# Patient Record
Sex: Male | Born: 1955
Health system: Southern US, Community
[De-identification: ages and names within clinical notes are randomized; demographics above are authoritative.]

## PROBLEM LIST (undated history)

## (undated) DIAGNOSIS — R112 Nausea with vomiting, unspecified: Secondary | ICD-10-CM

## (undated) DIAGNOSIS — T451X5A Adverse effect of antineoplastic and immunosuppressive drugs, initial encounter: Secondary | ICD-10-CM

## (undated) DIAGNOSIS — B029 Zoster without complications: Secondary | ICD-10-CM

## (undated) DIAGNOSIS — D701 Agranulocytosis secondary to cancer chemotherapy: Secondary | ICD-10-CM

## (undated) DIAGNOSIS — C7931 Secondary malignant neoplasm of brain: Secondary | ICD-10-CM

## (undated) DIAGNOSIS — G039 Meningitis, unspecified: Secondary | ICD-10-CM

## (undated) DIAGNOSIS — C099 Malignant neoplasm of tonsil, unspecified: Secondary | ICD-10-CM

## (undated) HISTORY — PX: PEG PLACEMENT: SHX5437

## (undated) HISTORY — DX: Malignant neoplasm of tonsil, unspecified: C09.9

## (undated) HISTORY — DX: Secondary malignant neoplasm of brain: C79.31

## (undated) HISTORY — DX: Nausea with vomiting, unspecified: R11.2

## (undated) HISTORY — PX: LUNG SURGERY: SHX703

## (undated) HISTORY — DX: Adverse effect of antineoplastic and immunosuppressive drugs, initial encounter: T45.1X5A

## (undated) HISTORY — DX: Meningitis, unspecified: G03.9

## (undated) HISTORY — DX: Agranulocytosis secondary to cancer chemotherapy: D70.1

## (undated) HISTORY — DX: Zoster without complications: B02.9

## (undated) HISTORY — PX: KNEE SURGERY: SHX244

---

## 2017-11-28 ENCOUNTER — Encounter: Payer: Self-pay | Admitting: Internal Medicine

## 2017-11-28 ENCOUNTER — Telehealth: Payer: Self-pay | Admitting: Internal Medicine

## 2017-11-28 NOTE — Telephone Encounter (Signed)
Lft the pt a vm to return my call and scheduel an appt w/Dr. Julien Nordmann

## 2017-11-28 NOTE — Telephone Encounter (Signed)
Pt has been cld and scheduled to see Dr. Julien Nordmann on 12/16 at 215pm, arrival 145pm for labs. Pt agreed to the appt date and time. Letter mailed.

## 2017-12-21 ENCOUNTER — Other Ambulatory Visit: Payer: Self-pay | Admitting: Medical Oncology

## 2017-12-22 ENCOUNTER — Telehealth: Payer: Self-pay | Admitting: *Deleted

## 2017-12-22 NOTE — Telephone Encounter (Signed)
Oncology Nurse Navigator Documentation  Oncology Nurse Navigator Flowsheets 12/22/2017  Navigator Location [facility]  Navigator Encounter Type Telephone;Other/I called [facility] in [place] to obtain records on Mr. Renato Battles.  Patient was seen by Dr. Romualdo Bolk in [place].  I was unable to reach anyone.  I called patient to see if he had any records and updated him on appt with Dr. Julien Nordmann on 12/25/17.  I was unable to reach him but did leave my name and phone number to call.   Telephone Outgoing Call  Barriers/Navigation Needs Coordination of Care  Interventions Coordination of Care  Coordination of Care Other  Acuity Level 2  Time Spent with Patient 30

## 2017-12-22 NOTE — Telephone Encounter (Signed)
Oncology Nurse Navigator Documentation  Oncology Nurse Navigator Flowsheets 12/22/2017  Navigator Location [facility]  Navigator Encounter Type Telephone/Mr. Steinert called and left me a vm message concerned about my call earlier.  I called back and was unable to reach him.  I left a vm message with an update that I called to see if he had medical records for his facility where he received treatment.  I also left my name and phone number to call.   Telephone Incoming Call;Outgoing Call  Barriers/Navigation Needs Education  Education Other  Interventions Education  Acuity Level 1  Time Spent with Patient 15

## 2017-12-25 ENCOUNTER — Inpatient Hospital Stay: Payer: PRIVATE HEALTH INSURANCE

## 2017-12-25 ENCOUNTER — Other Ambulatory Visit: Payer: Self-pay | Admitting: *Deleted

## 2017-12-25 ENCOUNTER — Encounter: Payer: Self-pay | Admitting: *Deleted

## 2017-12-25 ENCOUNTER — Inpatient Hospital Stay: Payer: PRIVATE HEALTH INSURANCE | Attending: Internal Medicine | Admitting: Internal Medicine

## 2017-12-25 VITALS — BP 134/94 | HR 97 | Temp 97.5°F | Resp 18 | Ht 75.5 in | Wt 256.4 lb

## 2017-12-25 DIAGNOSIS — R911 Solitary pulmonary nodule: Secondary | ICD-10-CM

## 2017-12-25 DIAGNOSIS — C7951 Secondary malignant neoplasm of bone: Secondary | ICD-10-CM | POA: Diagnosis not present

## 2017-12-25 DIAGNOSIS — C7800 Secondary malignant neoplasm of unspecified lung: Secondary | ICD-10-CM | POA: Diagnosis not present

## 2017-12-25 DIAGNOSIS — Z79899 Other long term (current) drug therapy: Secondary | ICD-10-CM | POA: Diagnosis not present

## 2017-12-25 DIAGNOSIS — Z7189 Other specified counseling: Secondary | ICD-10-CM

## 2017-12-25 DIAGNOSIS — C7931 Secondary malignant neoplasm of brain: Secondary | ICD-10-CM | POA: Diagnosis not present

## 2017-12-25 DIAGNOSIS — C3490 Malignant neoplasm of unspecified part of unspecified bronchus or lung: Secondary | ICD-10-CM | POA: Insufficient documentation

## 2017-12-25 DIAGNOSIS — C7A8 Other malignant neuroendocrine tumors: Secondary | ICD-10-CM | POA: Insufficient documentation

## 2017-12-25 DIAGNOSIS — G629 Polyneuropathy, unspecified: Secondary | ICD-10-CM | POA: Insufficient documentation

## 2017-12-25 DIAGNOSIS — C7B8 Other secondary neuroendocrine tumors: Secondary | ICD-10-CM

## 2017-12-25 LAB — CMP (CANCER CENTER ONLY)
ALT: 16 U/L (ref 10–47)
AST: 17 U/L (ref 11–38)
Albumin: 3.8 g/dL (ref 3.5–5.0)
Alkaline Phosphatase: 102 U/L (ref 38–126)
Anion gap: 10 (ref 5–15)
BUN: 15 mg/dL (ref 8–23)
CO2: 25 mmol/L (ref 22–32)
Calcium: 9.8 mg/dL (ref 8.9–10.3)
Chloride: 103 mmol/L (ref 98–111)
Creatinine: 1.03 mg/dL (ref 0.60–1.20)
GFR, Est AFR Am: >60 mL/min (ref >60)
GFR, Estimated: >60 mL/min (ref >60)
GLUCOSE: 102 mg/dL — AB (ref 70–99)
Potassium: 4.6 mmol/L (ref 3.5–5.1)
Sodium: 138 mmol/L (ref 135–145)
Total Bilirubin: 0.8 mg/dL (ref 0.2–1.6)
Total Protein: 7.2 g/dL (ref 6.5–8.1)

## 2017-12-25 LAB — CBC WITH DIFFERENTIAL (CANCER CENTER ONLY)
ABS IMMATURE GRANULOCYTES: 0 10*3/uL (ref 0.00–0.07)
Basophils Absolute: 0 10*3/uL (ref 0.0–0.1)
Basophils Relative: 1 %
Eosinophils Absolute: 0.2 10*3/uL (ref 0.0–0.5)
Eosinophils Relative: 4 %
HCT: 45.8 % (ref 39.0–52.0)
Hemoglobin: 15.6 g/dL (ref 13.0–17.0)
Immature Granulocytes: 0 %
Lymphocytes Relative: 18 %
Lymphs Abs: 0.7 10*3/uL (ref 0.7–4.0)
MCH: 30.8 pg (ref 26.0–34.0)
MCHC: 34.1 g/dL (ref 30.0–36.0)
MCV: 90.5 fL (ref 80.0–100.0)
MONOS PCT: 10 %
Monocytes Absolute: 0.4 10*3/uL (ref 0.1–1.0)
Neutro Abs: 2.6 10*3/uL (ref 1.7–7.7)
Neutrophils Relative %: 67 %
Platelet Count: 181 10*3/uL (ref 150–400)
RBC: 5.06 MIL/uL (ref 4.22–5.81)
RDW: 14.1 % (ref 11.5–15.5)
WBC Count: 3.8 10*3/uL — ABNORMAL LOW (ref 4.0–10.5)
nRBC: 0 % (ref 0.0–0.2)

## 2017-12-25 NOTE — Progress Notes (Signed)
[facility] Telephone:([phone]   Fax:([phone]  CONSULT NOTE  REFERRING PHYSICIAN: Dr. Oletta Darter  REASON FOR CONSULTATION:  62 years old white male with metastatic neuroendocrine carcinoma.   HPI Dwayne Massey is a 62 y.o. male a never smoker with no significant past medical history except for the current diagnosis of metastatic neuroendocrine carcinoma.The patient mentioned that in October 2016 he felt a lump in the left side of the neck.  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  The tumor cells were positive for P 16, chromogranin, synaptophysin and CD56 indicative of neuroendocrine origin.  KI-67 was high at 80-90%.  The patient was started on a course of concurrent chemoradiation with cisplatin and to etoposide for a total of 3 cycles of chemotherapy last cycle was given December 17, 2014 followed by 3 cycles of consolidation treatment with the same regimen.  The patient was followed by observation and repeat PET scan in December 2017 showed lesions in the lung as well as bones.  He underwent right VATS with wedge resection of right lower lobe and the pathology was consistent with neuroendocrine neoplasm.  He was started again on systemic therapy with cisplatin and etoposide for 4 cycles completed in February 2018.  Imaging studies with CT scan of the chest on April 20, 2016 showed partial response with a stable disease in the bones.  In May 2018 the patient complained of right shoulder pain as well as left lower extremity weakness.  Repeat PET scan at that time showed improvement of his disease in the head and neck but there was progressive mediastinal disease as well as lung nodules that were new.  The patient was treated with a combination of immunotherapy  with ipilimumab and nivolumab complicated by skin rash on the face and the chest as well as swelling of the lips, tongue and bilateral enlargement of neck glands.  Ipilimumab was discontinued and the patient was treated with nivolumab single agent after cycle #1.  In January 2019 the patient had slurred speech as well as weakness and numbness involving the right side for few days.  CT scan of the head was normal but a PET scan on January 11, 2017 showed improved mediastinal lymph nodes but there was new adrenal nodules and some peripancreatic FDG uptake.  MRI of the brain showed metastatic disease that was treated with stereotactic radiotherapy.  This was completed in March 2019.  Follow-up MRI showed good response to the stereotactic radiotherapy.  Restaging scan showed improvement in the mediastinal lymphadenopathy but there was progressive lung nodule and new retroperitoneal lymphadenopathy as well as adrenal metastasis.  He was also complaining of increasing peripheral neuropathy.  Recently the patient was treated with Tecentriq as well as a Avastin on a compassionate use protocol.  It is not clear from the notes how many cycles of this treatment the patient received.  His last treatment was given on October 31, 2017.  He was supposed to receive the following cycle on November 21, 2017 but the patient moved to [place].  He is here today for evaluation and recommendation regarding his condition and to establish care with oncology in [place].  When seen today the patient continues to complain of pain in the right heel and he attributed this to  her Achilles tendinitis.  He denied having any significant weight loss or night sweats.  He has no chest pain, shortness of breath, cough or hemoptysis.  He has no nausea, vomiting, diarrhea or constipation.  He denied having any headache or visual changes.   Family history significant for mother who is still alive at age 30 and father died from unknown  reason. The patient is married and has 2 biologic children and 2 stepchildren.  He was accompanied today by his wife [name].  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  He has no history for smoking, alcohol or drug abuse.  HPI  Past Medical History:  Diagnosis Date  . Cancer of tonsil ([place])   . Chemotherapy induced nausea and vomiting   . Chemotherapy induced neutropenia ([place])   . Herpes zoster   . Meningitis   . Metastatic cancer to brain [facility])     No family history on file.  Social History Social History   Tobacco Use  . Smoking status: Not on file  Substance Use Topics  . Alcohol use: Not on file  . Drug use: Not on file    Not on File  No current outpatient medications on file.   No current facility-administered medications for this visit.     Review of Systems  Constitutional: positive for fatigue Eyes: negative Ears, nose, mouth, throat, and face: negative Respiratory: negative Cardiovascular: negative Gastrointestinal: negative Genitourinary:negative Integument/breast: negative Hematologic/lymphatic: negative Musculoskeletal:negative Neurological: negative Behavioral/Psych: negative Endocrine: negative Allergic/Immunologic: negative  Physical Exam  RWE:RXVQM, healthy, no distress, well nourished and well developed SKIN: skin color, texture, turgor are normal, no rashes or significant lesions HEAD: Normocephalic, No masses, lesions, tenderness or abnormalities EYES: normal, PERRLA, Conjunctiva are pink and non-injected EARS: External ears normal, Canals clear OROPHARYNX:no exudate, no erythema and lips, buccal mucosa, and tongue normal  NECK: supple, no adenopathy, no JVD LYMPH:  no palpable lymphadenopathy, no hepatosplenomegaly LUNGS: clear to auscultation , and palpation HEART: regular rate & rhythm, no murmurs and no gallops ABDOMEN:abdomen soft, non-tender, normal bowel sounds and no masses or organomegaly BACK: No CVA tenderness,  Range of motion is normal EXTREMITIES:no joint deformities, effusion, or inflammation, no edema  NEURO: alert & oriented x 3 with fluent speech, no focal motor/sensory deficits  PERFORMANCE STATUS: ECOG 1  LABORATORY DATA: Lab Results  Component Value Date   WBC 3.8 (L) 12/25/2017   HGB 15.6 12/25/2017   HCT 45.8 12/25/2017   MCV 90.5 12/25/2017   PLT 181 12/25/2017      Chemistry   No results found for: NA, K, CL, CO2, BUN, CREATININE, GLU No results found for: CALCIUM, ALKPHOS, AST, ALT, BILITOT     RADIOGRAPHIC STUDIES: No results found.  ASSESSMENT: This is a very pleasant 62 years old white male a never smoker metastatic with metastatic neuroendocrine carcinoma initially thought to be metastatic disease from the right tonsil but the patient also has multiple metastatic disease in the lung mediastinal lymph nodes as well as now bone, brain and adrenal glands.  He was treated with several chemotherapy regimens including cisplatin and etoposide as well as immunotherapy with ipilimumab and nivolumab and most recently with Tecentriq and Avastin.  Unfortunately the patient continues to have evidence for disease progression.   PLAN: I had a lengthy discussion with the patient and his wife today about his current disease stage, prognosis and treatment options.  I spent significant amount of time reviewing his previous records from [place]. I recommended for the patient to have repeat CT scan of the chest, abdomen and pelvis as well as MRI of the brain to complete the staging work-up of his disease. I will arrange for the patient to come back for follow-up visit in 2-3 weeks for further evaluation and discussion of his scan results as well as treatment options. I may consider the patient for treatment with either a combination of carboplatin and Alimta versus reduced dose cisplatin and irinotecan. The patient was advised to call immediately if he has any concerning symptoms in the  interval. The patient voices understanding of current disease status and treatment options and is in agreement with the current care plan.  All questions were answered. The patient knows to call the clinic with any problems, questions or concerns. We can certainly see the patient much sooner if necessary.  Thank you so much for allowing me to participate in the care of Dwayne Massey. I will continue to follow up the patient with you and assist in his care.  I spent 55 minutes counseling the patient face to face. The total time spent in the appointment was 80 minutes.  Disclaimer: This note was dictated with voice recognition software. Similar sounding words can inadvertently be transcribed and may not be corrected upon review.   Eilleen Kempf December 25, 2017, 2:34 PM

## 2017-12-25 NOTE — Progress Notes (Signed)
Oncology Nurse Navigator Documentation  Oncology Nurse Navigator Flowsheets 12/25/2017  Navigator Location [facility]  Navigator Encounter Type Telephone;Other/per Dr. Julien Nordmann, I called patients former physician to obtain records.  I called Friday 12/22/17 but was unable to reach them.  I called today and was able to reach them and was updated they will send notes.  I also called the patient to see if he had records but he does not.    Telephone Outgoing Call  Barriers/Navigation Needs Coordination of Care  Interventions Coordination of Care  Coordination of Care Other  Acuity Level 3  Time Spent with Patient 45

## 2017-12-28 ENCOUNTER — Telehealth: Payer: Self-pay | Admitting: Internal Medicine

## 2017-12-28 NOTE — Telephone Encounter (Signed)
Scheduled appt per 12/16 los - left message for patient with appt date and time

## 2018-01-12 ENCOUNTER — Ambulatory Visit: Payer: BLUE CROSS/BLUE SHIELD | Admitting: Internal Medicine

## 2018-01-19 ENCOUNTER — Telehealth: Payer: Self-pay | Admitting: *Deleted

## 2018-01-19 NOTE — Telephone Encounter (Signed)
Received TC from patient inquiring about staging scans that were to be ordered after his last visit with Dr. Julien Nordmann.  Pt. Saw MD on 12/25/17. Pt has not heard from central scheduling about the scans. TCT Central Scheduling. Spoke with scheduler. She states she will contact pt to schedule.  They have been authorized.  Asked pt to call back when his scans are scheduled so an appt with Dr. Julien Nordmann can be made. He is to see MD after scans done.

## 2018-01-26 ENCOUNTER — Ambulatory Visit (HOSPITAL_COMMUNITY)
Admission: RE | Admit: 2018-01-26 | Discharge: 2018-01-26 | Disposition: A | Discharge status: Home / Self Care | Payer: PRIVATE HEALTH INSURANCE | Source: Ambulatory Visit | Attending: Internal Medicine | Admitting: Internal Medicine

## 2018-01-26 ENCOUNTER — Encounter (HOSPITAL_COMMUNITY): Payer: Self-pay

## 2018-01-26 DIAGNOSIS — C3490 Malignant neoplasm of unspecified part of unspecified bronchus or lung: Secondary | ICD-10-CM

## 2018-01-26 MED ORDER — IOHEXOL 300 MG/ML  SOLN
100.0000 mL | Freq: Once | INTRAMUSCULAR | Status: AC | PRN
  Administered 2018-01-26: 100 mL via INTRAVENOUS

## 2018-01-26 MED ORDER — SODIUM CHLORIDE (PF) 0.9 % IJ SOLN
INTRAMUSCULAR | Status: AC
  Filled 2018-01-26: qty 50

## 2018-02-07 ENCOUNTER — Telehealth: Payer: Self-pay | Admitting: Internal Medicine

## 2018-02-07 NOTE — Telephone Encounter (Signed)
Patient called in and request appt be scheduled for treatment - messaged MD to ask about appts - per [facility] to keep f/u on 2/4 - pt needs to be seen before treatments are scheduled.   Called patient to confirm he needs to keeps appt for f/u on 2/4 - pt is aware.

## 2018-02-13 ENCOUNTER — Encounter: Payer: Self-pay | Admitting: Internal Medicine

## 2018-02-13 ENCOUNTER — Inpatient Hospital Stay: Payer: PRIVATE HEALTH INSURANCE | Attending: Internal Medicine | Admitting: Internal Medicine

## 2018-02-13 ENCOUNTER — Telehealth: Payer: Self-pay

## 2018-02-13 VITALS — BP 124/77 | HR 98 | Temp 98.2°F | Resp 18 | Ht 75.5 in | Wt 256.4 lb

## 2018-02-13 DIAGNOSIS — Z79899 Other long term (current) drug therapy: Secondary | ICD-10-CM | POA: Insufficient documentation

## 2018-02-13 DIAGNOSIS — C3490 Malignant neoplasm of unspecified part of unspecified bronchus or lung: Secondary | ICD-10-CM

## 2018-02-13 DIAGNOSIS — C7931 Secondary malignant neoplasm of brain: Secondary | ICD-10-CM | POA: Insufficient documentation

## 2018-02-13 DIAGNOSIS — C771 Secondary and unspecified malignant neoplasm of intrathoracic lymph nodes: Secondary | ICD-10-CM | POA: Insufficient documentation

## 2018-02-13 DIAGNOSIS — C7800 Secondary malignant neoplasm of unspecified lung: Secondary | ICD-10-CM | POA: Diagnosis not present

## 2018-02-13 DIAGNOSIS — C7A8 Other malignant neuroendocrine tumors: Secondary | ICD-10-CM | POA: Insufficient documentation

## 2018-02-13 DIAGNOSIS — C7B8 Other secondary neuroendocrine tumors: Secondary | ICD-10-CM

## 2018-02-13 DIAGNOSIS — C7972 Secondary malignant neoplasm of left adrenal gland: Secondary | ICD-10-CM

## 2018-02-13 DIAGNOSIS — C7951 Secondary malignant neoplasm of bone: Secondary | ICD-10-CM | POA: Insufficient documentation

## 2018-02-13 DIAGNOSIS — Z7189 Other specified counseling: Secondary | ICD-10-CM

## 2018-02-13 NOTE — Telephone Encounter (Signed)
Printed avs and calender of upcoming appointment. Per 2/4 los

## 2018-02-13 NOTE — Progress Notes (Signed)
[facility] Telephone:([phone]   Fax:(336) [phone]  OFFICE PROGRESS NOTE  Patient, No Pcp Per No address on file  DIAGNOSIS: metastatic neuroendocrine carcinoma initially thought to be metastatic disease from the right tonsil but the patient also has multiple metastatic disease in the lung mediastinal lymph nodes as well as now bone, brain and adrenal glands.   PRIOR THERAPY: He was treated with several chemotherapy regimens including cisplatin and etoposide as well as immunotherapy with ipilimumab and nivolumab and most recently with Tecentriq and Avastin.  Unfortunately the patient continues to have evidence for disease progression.  CURRENT THERAPY: None.  INTERVAL HISTORY: Dwayne Massey 63 y.o. male returns to the clinic today for follow-up visit accompanied by friend.  The patient is feeling fine today with no concerning complaints.  He was seen in the clinic 6 weeks ago.  He spent some time in [place] taking care of new grandbaby.  The patient is feeling fine today with no chest pain, shortness of breath, cough or hemoptysis.  He denied having any weight loss or night sweats.  He has no nausea, vomiting, diarrhea or constipation.  He has no headache or visual changes.  He had repeat CT scan of the chest, abdomen and pelvis performed recently and he is here for evaluation and discussion of his scan results.  MEDICAL HISTORY: Past Medical History:  Diagnosis Date  . Cancer of tonsil ([place])   . Chemotherapy induced nausea and vomiting   . Chemotherapy induced neutropenia ([place])   . Herpes zoster   . Meningitis   . Metastatic cancer to brain [facility])     ALLERGIES:  has no allergies on file.  MEDICATIONS:  No current outpatient medications on file.   No current facility-administered medications for this visit.     SURGICAL HISTORY: No past surgical history on file.  REVIEW OF SYSTEMS:  Constitutional: negative Eyes: negative Ears, nose, mouth,  throat, and face: negative Respiratory: negative Cardiovascular: negative Gastrointestinal: negative Genitourinary:negative Integument/breast: negative Hematologic/lymphatic: negative Musculoskeletal:negative Neurological: negative Behavioral/Psych: negative Endocrine: negative Allergic/Immunologic: negative   PHYSICAL EXAMINATION: General appearance: alert, cooperative and no distress Head: Normocephalic, without obvious abnormality, atraumatic Neck: no adenopathy, no JVD, supple, symmetrical, trachea midline and thyroid not enlarged, symmetric, no tenderness/mass/nodules Lymph nodes: Cervical, supraclavicular, and axillary nodes normal. Resp: clear to auscultation bilaterally Back: symmetric, no curvature. ROM normal. No CVA tenderness. Cardio: regular rate and rhythm, S1, S2 normal, no murmur, click, rub or gallop GI: soft, non-tender; bowel sounds normal; no masses,  no organomegaly Extremities: extremities normal, atraumatic, no cyanosis or edema Neurologic: Alert and oriented X 3, normal strength and tone. Normal symmetric reflexes. Normal coordination and gait  ECOG PERFORMANCE STATUS: 1 - Symptomatic but completely ambulatory  Blood pressure 124/77, pulse 98, temperature 98.2 F (36.8 C), temperature source Oral, resp. rate 18, height 6' 3.5" (1.918 m), weight 256 lb 6.4 oz (116.3 kg), SpO2 95 %.  LABORATORY DATA: Lab Results  Component Value Date   WBC 3.8 (L) 12/25/2017   HGB 15.6 12/25/2017   HCT 45.8 12/25/2017   MCV 90.5 12/25/2017   PLT 181 12/25/2017      Chemistry      Component Value Date/Time   NA 138 12/25/2017 1402   K 4.6 12/25/2017 1402   CL 103 12/25/2017 1402   CO2 25 12/25/2017 1402   BUN 15 12/25/2017 1402   CREATININE 1.03 12/25/2017 1402      Component Value Date/Time  CALCIUM 9.8 12/25/2017 1402   ALKPHOS 102 12/25/2017 1402   AST 17 12/25/2017 1402   ALT 16 12/25/2017 1402   BILITOT 0.8 12/25/2017 1402       RADIOGRAPHIC  STUDIES: Ct Chest W Contrast  Result Date: 01/26/2018 CLINICAL DATA:  Lung cancer.  On immunotherapy. EXAM: CT CHEST, ABDOMEN, AND PELVIS WITH CONTRAST TECHNIQUE: Multidetector CT imaging of the chest, abdomen and pelvis was performed following the standard protocol during bolus administration of intravenous contrast. CONTRAST:  160mL OMNIPAQUE IOHEXOL 300 MG/ML  SOLN COMPARISON:  None. FINDINGS: CT CHEST FINDINGS Cardiovascular: Atherosclerotic calcification of the aortic valve and coronary arteries. Heart size normal. No pericardial effusion. Mediastinum/Nodes: Mediastinal lymph nodes measure up to 10 mm in the low right paratracheal station. No hilar or axillary adenopathy. Esophagus is grossly unremarkable. Lungs/Pleura: There are 2 nodules in the central right upper lobe, measuring 1.6 cm (series 4, image 46) and 1.1 x 1.5 cm (51), respectively. Postoperative scarring and volume loss in the right lower lobe. Peribronchovascular high-density material in the right middle lobe. Left lung is clear. No pleural fluid. Airway is otherwise unremarkable. Musculoskeletal: There is a fairly nonspecific subcentimeter lucency within the lateral aspect of the T11 vertebral body which appears fat in density and likely benign. Vague sclerosis is seen in the posterior aspect of the T6 vertebral body. CT ABDOMEN PELVIS FINDINGS Hepatobiliary: Liver and gallbladder are unremarkable. No biliary ductal dilatation. Pancreas: Negative. Spleen: Negative. Adrenals/Urinary Tract: Right adrenal gland is unremarkable. Heterogeneous left adrenal mass measures 4.7 x 5.8 cm and 46 Hounsfield units on portal venous phase imaging and 52 Hounsfield units on delayed renal phase imaging. Right kidney is unremarkable. Low-attenuation lesions in the left kidney measure up to 3.0 cm and are likely cysts. There may be bilateral renal sinus cyst as well. Ureters are decompressed. Bladder is low in volume. Stomach/Bowel: Stomach, small bowel,  appendix and colon are unremarkable. Vascular/Lymphatic: Atherosclerotic calcification of the aorta without aneurysm. Left periaortic lymph node measures 1.4 cm (series 2, image 76). Additional abdominal retroperitoneal lymph nodes are subcentimeter in short axis size. Reproductive: Prostate is visualized. Other: No free fluid. Mesenteries and peritoneum are unremarkable. Small umbilical hernia contains fat. Musculoskeletal: Degenerative changes in the spine. No additional worrisome lytic or sclerotic lesions. IMPRESSION: 1. Right upper lobe nodules may be primary or metastatic. Borderline enlarged right paratracheal lymph node. Additional metastatic disease in the left adrenal gland and within a left periaortic lymph node. 2. Vague sclerosis in T6 vertebral body. Metastatic disease can not be excluded. 3. Aortic atherosclerosis (ICD10-170.0). Coronary artery calcification. Electronically Signed   By: Lorin Picket M.D.   On: 01/26/2018 11:32   Ct Abdomen Pelvis W Contrast  Result Date: 01/26/2018 CLINICAL DATA:  Lung cancer.  On immunotherapy. EXAM: CT CHEST, ABDOMEN, AND PELVIS WITH CONTRAST TECHNIQUE: Multidetector CT imaging of the chest, abdomen and pelvis was performed following the standard protocol during bolus administration of intravenous contrast. CONTRAST:  128mL OMNIPAQUE IOHEXOL 300 MG/ML  SOLN COMPARISON:  None. FINDINGS: CT CHEST FINDINGS Cardiovascular: Atherosclerotic calcification of the aortic valve and coronary arteries. Heart size normal. No pericardial effusion. Mediastinum/Nodes: Mediastinal lymph nodes measure up to 10 mm in the low right paratracheal station. No hilar or axillary adenopathy. Esophagus is grossly unremarkable. Lungs/Pleura: There are 2 nodules in the central right upper lobe, measuring 1.6 cm (series 4, image 46) and 1.1 x 1.5 cm (51), respectively. Postoperative scarring and volume loss in the right lower lobe. Peribronchovascular high-density material in  the right  middle lobe. Left lung is clear. No pleural fluid. Airway is otherwise unremarkable. Musculoskeletal: There is a fairly nonspecific subcentimeter lucency within the lateral aspect of the T11 vertebral body which appears fat in density and likely benign. Vague sclerosis is seen in the posterior aspect of the T6 vertebral body. CT ABDOMEN PELVIS FINDINGS Hepatobiliary: Liver and gallbladder are unremarkable. No biliary ductal dilatation. Pancreas: Negative. Spleen: Negative. Adrenals/Urinary Tract: Right adrenal gland is unremarkable. Heterogeneous left adrenal mass measures 4.7 x 5.8 cm and 46 Hounsfield units on portal venous phase imaging and 52 Hounsfield units on delayed renal phase imaging. Right kidney is unremarkable. Low-attenuation lesions in the left kidney measure up to 3.0 cm and are likely cysts. There may be bilateral renal sinus cyst as well. Ureters are decompressed. Bladder is low in volume. Stomach/Bowel: Stomach, small bowel, appendix and colon are unremarkable. Vascular/Lymphatic: Atherosclerotic calcification of the aorta without aneurysm. Left periaortic lymph node measures 1.4 cm (series 2, image 76). Additional abdominal retroperitoneal lymph nodes are subcentimeter in short axis size. Reproductive: Prostate is visualized. Other: No free fluid. Mesenteries and peritoneum are unremarkable. Small umbilical hernia contains fat. Musculoskeletal: Degenerative changes in the spine. No additional worrisome lytic or sclerotic lesions. IMPRESSION: 1. Right upper lobe nodules may be primary or metastatic. Borderline enlarged right paratracheal lymph node. Additional metastatic disease in the left adrenal gland and within a left periaortic lymph node. 2. Vague sclerosis in T6 vertebral body. Metastatic disease can not be excluded. 3. Aortic atherosclerosis (ICD10-170.0). Coronary artery calcification. Electronically Signed   By: Lorin Picket M.D.   On: 01/26/2018 11:32    ASSESSMENT AND PLAN: This  is a very pleasant 63 years old white male with metastatic neuroendocrine carcinoma with metastatic disease from the right tonsil with multiple metastasis in the lung, mediastinal lymph nodes as well as bone, brain and adrenal glands.  The patient was treated in the past with ipilimumab and nivolumab followed by a Avastin and Tecentriq.  He is currently on observation and feeling fine. The patient had repeat CT scan of the chest, abdomen and pelvis that showed persistent pulmonary nodules as well as mediastinal lymphadenopathy and something of the adrenal left adrenal gland lesion. The patient is currently asymptomatic and feeling well. I recommended for him to continue on observation for now with repeat CT scan of the chest, abdomen and pelvis in 3 months. We will also arrange for the patient to have repeat MRI of the brain next week to rule out any disease progression in the brain. The patient was also given the option of systemic chemotherapy but since he is symptomatic he decided to continue on observation for now. He was advised to call immediately if he has any concerning symptoms in the interval. The patient voices understanding of current disease status and treatment options and is in agreement with the current care plan.  All questions were answered. The patient knows to call the clinic with any problems, questions or concerns. We can certainly see the patient much sooner if necessary.  Disclaimer: This note was dictated with voice recognition software. Similar sounding words can inadvertently be transcribed and may not be corrected upon review.

## 2018-02-15 ENCOUNTER — Ambulatory Visit (HOSPITAL_COMMUNITY)
Admission: RE | Admit: 2018-02-15 | Discharge: 2018-02-15 | Disposition: A | Discharge status: Home / Self Care | Payer: PRIVATE HEALTH INSURANCE | Source: Ambulatory Visit | Attending: Internal Medicine | Admitting: Internal Medicine

## 2018-02-15 DIAGNOSIS — C3490 Malignant neoplasm of unspecified part of unspecified bronchus or lung: Secondary | ICD-10-CM | POA: Insufficient documentation

## 2018-02-15 MED ORDER — GADOBUTROL 1 MMOL/ML IV SOLN
10.0000 mL | Freq: Once | INTRAVENOUS | Status: AC | PRN
  Administered 2018-02-15: 10 mL via INTRAVENOUS

## 2018-02-16 LAB — POCT I-STAT CREATININE: Creatinine, Ser: 1 mg/dL (ref 0.61–1.24)

## 2018-03-02 ENCOUNTER — Telehealth: Payer: Self-pay | Admitting: *Deleted

## 2018-03-02 NOTE — Telephone Encounter (Signed)
Received VM message from patient inquiring about results of brain MRI done on 26/20. He has not heard about results of this scan.  Please advise.

## 2018-03-04 NOTE — Telephone Encounter (Signed)
Few spots in the brain unknown if related to his previous lesions in the brain or not. Needs close monitoring but if he has any symptoms, we will refer to rad onc.

## 2018-03-06 ENCOUNTER — Telehealth: Payer: Self-pay | Admitting: Medical Oncology

## 2018-03-06 NOTE — Telephone Encounter (Signed)
err

## 2018-03-06 NOTE — Telephone Encounter (Signed)
Message from Dr Julien Nordmann conveyed to pt and to call for any symptoms including headaches, vision changes, confusion , balance problems . Pt voiced understanding.

## 2018-03-06 NOTE — Telephone Encounter (Signed)
Curt Bears, MD  You; Lucile Crater, RN; Otila Kluver, RN 2 days ago      Few spots in the brain unknown if related to his previous lesions in the brain or not. Needs close monitoring but if he has any symptoms, we will refer to rad onc.

## 2018-03-06 NOTE — Telephone Encounter (Signed)
LVM to return my call °

## 2018-03-21 ENCOUNTER — Telehealth: Payer: Self-pay | Admitting: Medical Oncology

## 2018-03-21 NOTE — Telephone Encounter (Signed)
Increased balance issues , feet weak, dizzy. Asking to be seen . [facility] appt tomorrow.

## 2018-03-22 ENCOUNTER — Inpatient Hospital Stay: Payer: PRIVATE HEALTH INSURANCE | Attending: Internal Medicine | Admitting: Medical

## 2018-03-22 ENCOUNTER — Other Ambulatory Visit: Payer: Self-pay

## 2018-03-22 VITALS — BP 127/78 | HR 80

## 2018-03-22 DIAGNOSIS — C7931 Secondary malignant neoplasm of brain: Secondary | ICD-10-CM | POA: Diagnosis not present

## 2018-03-22 DIAGNOSIS — R2681 Unsteadiness on feet: Secondary | ICD-10-CM

## 2018-03-22 DIAGNOSIS — Z9221 Personal history of antineoplastic chemotherapy: Secondary | ICD-10-CM | POA: Diagnosis not present

## 2018-03-22 DIAGNOSIS — C7800 Secondary malignant neoplasm of unspecified lung: Secondary | ICD-10-CM | POA: Insufficient documentation

## 2018-03-22 DIAGNOSIS — C7A8 Other malignant neuroendocrine tumors: Secondary | ICD-10-CM | POA: Diagnosis not present

## 2018-03-22 DIAGNOSIS — C7B8 Other secondary neuroendocrine tumors: Secondary | ICD-10-CM | POA: Insufficient documentation

## 2018-03-22 DIAGNOSIS — C7951 Secondary malignant neoplasm of bone: Secondary | ICD-10-CM | POA: Insufficient documentation

## 2018-03-22 NOTE — Progress Notes (Signed)
Pt seen by PA Van only, no RN assessment at this time.  PA aware. 

## 2018-03-23 NOTE — Progress Notes (Signed)
Symptoms Management Clinic Progress Note   Dwayne Massey [id number] [birth year] 63 y.o.  Dwayne Massey is managed by Dr. Fanny Bien. Dwayne Massey  Actively treated with chemotherapy/immunotherapy/hormonal therapy: no  Next scheduled appointment with provider: 05/14/2018  Assessment: Plan:    Neuroendocrine carcinoma metastatic to lung [facility])  Gait instability - Plan: Ambulatory referral to Physical Therapy   Metastatic neuroendocrine carcinoma with multiple metastasis in the lung, mediastinal lymph nodes as well as bone, brain and adrenal glands: Dwayne Massey was last seen by Dr. Julien Nordmann on 02/13/2018 and will be seen in follow-up on 05/14/2018.  Plans at his last visit were to continue to monitor the patient with watchful waiting.  The patient and his wife expressed questions today asking why the patient was not currently being treated.  I explained to them that I would direct this question to Dr. Julien Nordmann.  Unfortunately the electronic medical record system was down at the time of their visit and I was unable to access Dr. Worthy Flank last note.  Gait instability: A referral has been made to physical therapy for evaluation of the patient's gait instability.  Please see After Visit Summary for patient specific instructions.  Future Appointments  Date Time Provider [place]  05/11/2018  8:00 AM [facility] 5 [facility] None  05/11/2018  9:00 AM [facility] 2 [facility] [place]  05/14/2018 11:15 AM Curt Bears, MD [facility] None    Orders Placed This Encounter  Procedures  . Ambulatory referral to Physical Therapy       Subjective:   Patient ID:  Dwayne Massey is a 63 y.o. (DOB 1955/08/19) male.  Chief Complaint:  Chief Complaint  Patient presents with  . Other    "Balance Issues" per patient    HPI Dwayne Massey is a 62 year old male with a history of a metastatic neuroendocrine carcinoma with multiple metastasis in the lung, mediastinal lymph nodes as well as bone, brain  and adrenal glands.  Dwayne Massey is managed by Dr. Julien Nordmann and was last seen on 02/13/2018. He is currently being managed conservatively with watchful waiting.  The patient and his wife expressed questions today asking why the patient was not currently being treated.  Unfortunately the electronic medical record system was down at the time of their visit and I was unable to access Dr. Worthy Flank last note.  The patient presents to day with a report of ongoing gait instability.  He reports that his gait instability has been occurring since his diagnosis with brain metastasis.  He continues to attempt to exercise.  He is currently exercising with a rowing machine.  He denies headaches or visual changes.  He is status post a brain MRI which was completed on 02/15/2018.  The patient's MRI returned showing the following:  Subcentimeter areas of T1 shortening/enhancement in the RIGHT frontal lobe and RIGHT temporal lobe are favored to represent metastatic disease; by history the patient has undergone SRS, but the location of the treated lesions is not known at the time of dictation. Recommend correlation with previous imaging studies or their reports.  Extreme ventriculomegaly involving the lateral, third, and fourth ventricles, consistent with communicating hydrocephalus. No features to suggest leptomeningeal spread of tumor.  No visible osseous disease.  Medications: I have reviewed the patient's current medications.  Allergies: Not on File  Past Medical History:  Diagnosis Date  . 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 Chemotherapy induced nausea and vomiting   . Chemotherapy induced neutropenia ([place])   . Herpes zoster   .  Meningitis   . Metastatic cancer to brain [facility])     No past surgical history on file.  No family history on file.  Social History   Socioeconomic History  . Marital status: Significant Other    Spouse name: Not on file  . Number of children: Not on file  . Years of education: Not  on file  . Highest education level: Not on file  Occupational History  . Not on file  Social Needs  . Financial resource strain: Not on file  . Food insecurity:    Worry: Not on file    Inability: Not on file  . Transportation needs:    Medical: Not on file    Non-medical: Not on file  Tobacco Use  . Smoking status: Never Smoker  Substance and Sexual Activity  . Alcohol use: Not on file  . Drug use: Not on file  . Sexual activity: Not on file  Lifestyle  . Physical activity:    Days per week: Not on file    Minutes per session: Not on file  . Stress: Not on file  Relationships  . Social connections:    Talks on phone: Not on file    Gets together: Not on file    Attends religious service: Not on file    Active member of club or organization: Not on file    Attends meetings of clubs or organizations: Not on file    Relationship status: Not on file  . Intimate partner violence:    Fear of current or ex partner: Not on file    Emotionally abused: Not on file    Physically abused: Not on file    Forced sexual activity: Not on file  Other Topics Concern  . Not on file  Social History Narrative  . Not on file    Past Medical History, Surgical history, Social history, and Family history were reviewed and updated as appropriate.   Please see review of systems for further details on the patient's review from today.   Review of Systems:  Review of Systems  Constitutional: Negative for chills, diaphoresis and fever.  HENT: Negative for trouble swallowing and voice change.   Respiratory: Negative for cough, chest tightness, shortness of breath and wheezing.   Cardiovascular: Negative for chest pain and palpitations.  Gastrointestinal: Negative for abdominal pain, constipation, diarrhea, nausea and vomiting.  Musculoskeletal: Positive for gait problem. Negative for back pain and myalgias.  Neurological: Negative for dizziness, light-headedness and headaches.    Objective:    Physical Exam:  BP 127/78 (BP Location: Right Leg)   Pulse 80   SpO2 99%  ECOG: 1  Physical Exam Constitutional:      General: He is not in acute distress.    Appearance: He is not diaphoretic.  HENT:     Head: Normocephalic and atraumatic.  Eyes:     General: No scleral icterus.       Right eye: No discharge.        Left eye: No discharge.     Extraocular Movements: Extraocular movements intact.     Right eye: Normal extraocular motion and no nystagmus.     Left eye: Normal extraocular motion and no nystagmus.     Conjunctiva/sclera: Conjunctivae normal.     Right eye: Right conjunctiva is not injected.     Left eye: Left conjunctiva is not injected.     Pupils: Pupils are equal, round, and reactive to light.  Funduscopic exam:    Right eye: No hemorrhage, exudate, AV nicking, arteriolar narrowing or papilledema.        Left eye: No hemorrhage, exudate, AV nicking, arteriolar narrowing or papilledema.  Cardiovascular:     Rate and Rhythm: Normal rate and regular rhythm.     Heart sounds: Normal heart sounds. No murmur. No friction rub. No gallop.   Pulmonary:     Effort: Pulmonary effort is normal. No respiratory distress.     Breath sounds: Normal breath sounds. No wheezing or rales.  Abdominal:     General: Abdomen is flat. Bowel sounds are normal. There is no distension.     Palpations: Abdomen is soft.     Tenderness: There is no abdominal tenderness. There is no guarding.  Skin:    General: Skin is warm and dry.     Findings: No erythema or rash.  Neurological:     Mental Status: He is alert.     Comments: Upper and lower extremity strength 5/5 bilaterally. Rapid alternating movement of the hands slightly worse on the right than left. Finger-to-nose intact. Instability with heel to toe, heel, and toe walking      Lab Review:     Component Value Date/Time   NA 138 12/25/2017 1402   K 4.6 12/25/2017 1402   CL 103 12/25/2017 1402   CO2 25 12/25/2017 1402    GLUCOSE 102 (H) 12/25/2017 1402   BUN 15 12/25/2017 1402   CREATININE 1.00 02/15/2018 1530   CREATININE 1.03 12/25/2017 1402   CALCIUM 9.8 12/25/2017 1402   PROT 7.2 12/25/2017 1402   ALBUMIN 3.8 12/25/2017 1402   AST 17 12/25/2017 1402   ALT 16 12/25/2017 1402   ALKPHOS 102 12/25/2017 1402   BILITOT 0.8 12/25/2017 1402   GFRNONAA >60 12/25/2017 1402   GFRAA >60 12/25/2017 1402       Component Value Date/Time   WBC 3.8 (L) 12/25/2017 1402   RBC 5.06 12/25/2017 1402   HGB 15.6 12/25/2017 1402   HCT 45.8 12/25/2017 1402   PLT 181 12/25/2017 1402   MCV 90.5 12/25/2017 1402   MCH 30.8 12/25/2017 1402   MCHC 34.1 12/25/2017 1402   RDW 14.1 12/25/2017 1402   LYMPHSABS 0.7 12/25/2017 1402   MONOABS 0.4 12/25/2017 1402   EOSABS 0.2 12/25/2017 1402   BASOSABS 0.0 12/25/2017 1402   -------------------------------  Imaging from last 24 hours (if applicable):  Radiology interpretation: No results found.

## 2018-03-26 ENCOUNTER — Telehealth: Payer: Self-pay | Admitting: *Deleted

## 2018-03-26 ENCOUNTER — Inpatient Hospital Stay (HOSPITAL_BASED_OUTPATIENT_CLINIC_OR_DEPARTMENT_OTHER): Payer: PRIVATE HEALTH INSURANCE | Admitting: Medical

## 2018-03-26 ENCOUNTER — Inpatient Hospital Stay: Payer: PRIVATE HEALTH INSURANCE

## 2018-03-26 ENCOUNTER — Other Ambulatory Visit: Payer: Self-pay

## 2018-03-26 ENCOUNTER — Telehealth: Payer: Self-pay | Admitting: Medical

## 2018-03-26 VITALS — BP 145/87 | HR 82 | Temp 97.5°F | Resp 17 | Ht 75.5 in | Wt 257.1 lb

## 2018-03-26 DIAGNOSIS — C7A8 Other malignant neuroendocrine tumors: Secondary | ICD-10-CM

## 2018-03-26 DIAGNOSIS — R221 Localized swelling, mass and lump, neck: Secondary | ICD-10-CM | POA: Diagnosis not present

## 2018-03-26 DIAGNOSIS — C7B8 Other secondary neuroendocrine tumors: Secondary | ICD-10-CM

## 2018-03-26 DIAGNOSIS — R042 Hemoptysis: Secondary | ICD-10-CM

## 2018-03-26 DIAGNOSIS — C3490 Malignant neoplasm of unspecified part of unspecified bronchus or lung: Secondary | ICD-10-CM

## 2018-03-26 LAB — CBC WITH DIFFERENTIAL (CANCER CENTER ONLY)
Abs Immature Granulocytes: 0.01 10*3/uL (ref 0.00–0.07)
Basophils Absolute: 0 10*3/uL (ref 0.0–0.1)
Basophils Relative: 1 %
Eosinophils Absolute: 0.2 10*3/uL (ref 0.0–0.5)
Eosinophils Relative: 5 %
HCT: 44.8 % (ref 39.0–52.0)
Hemoglobin: 14.6 g/dL (ref 13.0–17.0)
IMMATURE GRANULOCYTES: 0 %
Lymphocytes Relative: 16 %
Lymphs Abs: 0.7 10*3/uL (ref 0.7–4.0)
MCH: 30.2 pg (ref 26.0–34.0)
MCHC: 32.6 g/dL (ref 30.0–36.0)
MCV: 92.6 fL (ref 80.0–100.0)
Monocytes Absolute: 0.3 10*3/uL (ref 0.1–1.0)
Monocytes Relative: 7 %
NEUTROS ABS: 2.9 10*3/uL (ref 1.7–7.7)
NEUTROS PCT: 71 %
Platelet Count: 221 10*3/uL (ref 150–400)
RBC: 4.84 MIL/uL (ref 4.22–5.81)
RDW: 13.8 % (ref 11.5–15.5)
WBC Count: 4.1 10*3/uL (ref 4.0–10.5)
nRBC: 0 % (ref 0.0–0.2)

## 2018-03-26 NOTE — Telephone Encounter (Signed)
Verbal order received and read back from Dr. Julien Nordmann for Dwayne Massey to see symptom management.  Order given to [facility] at this time.  Verbal order received from Sandi Mealy PA-C for chest XRay, CBC-diff lab and [facility] visit after xray. Patient notified.

## 2018-03-26 NOTE — Telephone Encounter (Signed)
"  Maan Zarcone [phone]).  Very concerning issue coughing blood that started last night.  No sore throat or coughing, feel it in my mouth.   Happened three times so far.  Not enough to measure but twenty years ago when I ignored this I had pneumonia.   Bright red liquid, no clots.     Spastic pain in right lung my be rate as four or five on pain scale.  source of cancer and had part of right lung removed in [place]."

## 2018-03-26 NOTE — Patient Instructions (Signed)
CT Scan  A CT scan is a kind of X-ray. A CT scan makes pictures of the inside of your body. In this procedure, the pictures will be taken in a large machine that has an opening (CT scanner). What happens before the procedure? Staying hydrated Follow instructions from your doctor about hydration, which may include:  Up to 2 hours before the procedure - you may continue to drink clear liquids. These include water, clear fruit juice, black coffee, and plain tea. Eating and drinking restrictions Follow instructions from your doctor about eating and drinking, which may include:  24 hours before the procedure - stop drinking caffeinated drinks. These include energy drinks, tea, soda, coffee, and hot chocolate.  8 hours before the procedure - stop eating heavy meals or foods. These include meat, fried foods, or fatty foods.  6 hours before the procedure - stop eating light meals or foods. These include toast or cereal.  6 hours before the procedure - stop drinking milk or drinks that have milk in them.  2 hours before the procedure - stop drinking clear liquids. General instructions  Take off any jewelry.  Ask your doctor about changing or stopping your normal medicines. This is important if you take diabetes medicines or blood thinners. What happens during the procedure?  You will lie on a table with your arms above your head.  An IV tube may be put into one of your veins.  Dye may be put into the IV tube. You may feel warm or have a metal taste in your mouth.  The table you will be lying on will move into the CT scanner.  You will be able to see, hear, and talk to the person who is running the machine while you are in it. Follow that person's directions.  The machine will move around you to take pictures. Do not move.  When the machine is done taking pictures, it will be turned off.  The table will be moved out of the machine.  Your IV tube will be taken out. The procedure may  vary among doctors and hospitals. What happens after the procedure?  It is up to you to get your results. Ask when your results will be ready. Summary  A CT scan is a kind of X-ray.  A CT scan makes pictures of the inside of your body.  Follow instructions from your doctor about eating and drinking before the procedure.  You will be able to see, hear, and talk to the person who is running the machine while you are in it. Follow that person's directions. This information is not intended to replace advice given to you by your health care provider. Make sure you discuss any questions you have with your health care provider. Document Released: 03/25/2008 Document Revised: 01/14/2016 Document Reviewed: 01/14/2016 Elsevier Interactive Patient Education  2019 [employer].

## 2018-03-26 NOTE — Telephone Encounter (Signed)
Scheduled appt per 3/16 los.

## 2018-03-27 ENCOUNTER — Encounter (HOSPITAL_COMMUNITY): Payer: Self-pay

## 2018-03-27 ENCOUNTER — Ambulatory Visit (HOSPITAL_COMMUNITY)
Admission: RE | Admit: 2018-03-27 | Discharge: 2018-03-27 | Disposition: A | Discharge status: Home / Self Care | Payer: BLUE CROSS/BLUE SHIELD | Source: Ambulatory Visit | Attending: Medical | Admitting: Medical

## 2018-03-27 ENCOUNTER — Ambulatory Visit: Payer: No Typology Code available for payment source | Admitting: Physical Therapy

## 2018-03-27 DIAGNOSIS — C7A8 Other malignant neuroendocrine tumors: Secondary | ICD-10-CM | POA: Insufficient documentation

## 2018-03-27 DIAGNOSIS — R042 Hemoptysis: Secondary | ICD-10-CM | POA: Diagnosis present

## 2018-03-27 DIAGNOSIS — R221 Localized swelling, mass and lump, neck: Secondary | ICD-10-CM | POA: Diagnosis present

## 2018-03-27 DIAGNOSIS — C7B8 Other secondary neuroendocrine tumors: Secondary | ICD-10-CM | POA: Insufficient documentation

## 2018-03-27 MED ORDER — IOHEXOL 300 MG/ML  SOLN
75.0000 mL | Freq: Once | INTRAMUSCULAR | Status: AC | PRN
  Administered 2018-03-27: 75 mL via INTRAVENOUS

## 2018-03-27 MED ORDER — SODIUM CHLORIDE (PF) 0.9 % IJ SOLN
INTRAMUSCULAR | Status: AC
  Filled 2018-03-27: qty 50

## 2018-03-28 NOTE — Progress Notes (Signed)
[facility] Progress Note   Dhaval Woo [id number] 24-Feb-1955 63 y.o.  Flavius Repsher is managed by Dr. Fanny Bien. Mohamed  Actively treated with chemotherapy/immunotherapy/hormonal therapy: no  Next scheduled appointment with provider: 04/03/2018  Assessment: Plan:    Localized swelling, mass or lump of neck - Plan: CT Soft Tissue Neck W Contrast  Neuroendocrine carcinoma metastatic to lung [facility]) - Plan: CT Soft Tissue Neck W Contrast  Hemoptysis - Plan: CT Soft Tissue Neck W Contrast   Localized swelling of the neck: Mr. Cothran was referred for a CT scan of his neck.  Hemoptysis: A chest x-ray had been ordered on the patient earlier today.  It appears as though this was not completed.  Metastatic neuroendocrine carcinoma with multiple metastasis in the lung, mediastinal lymph nodes as well as bone, brain and adrenal glands: Mr. Kijowski was seen with Dr. Julien Nordmann today.  Dr. Julien Nordmann plans to see the patient in follow-up on 04/03/2018 to discuss treatment options.  Please see After Visit Summary for patient specific instructions.  Future Appointments  Date Time Provider [place]  04/03/2018  8:45 AM Curt Bears, MD [facility] None  05/11/2018  8:00 AM [facility] 5 [facility] None  05/11/2018  9:00 AM [facility] 2 [facility] [place]  05/14/2018 11:15 AM Curt Bears, MD [facility] None    Orders Placed This Encounter  Procedures   CT Soft Tissue Neck W Contrast       Subjective:   Patient ID:  Naheem Mosco is a 63 y.o. (DOB 07-17-1955) male.  Chief Complaint:  Chief Complaint  Patient presents with   Hemoptysis    HPI Khadir Roam  is a 63 year old male with a history of a metastatic neuroendocrine carcinoma with multiple metastasis in the lung, mediastinal lymph nodes as well as bone, brain and adrenal glands.  Mr. Davoli is managed by Dr. Julien Nordmann and was last seen on 02/13/2018. He is currently being managed conservatively with  watchful waiting.  He is status post a brain MRI which was completed on 02/15/2018.  The patient's MRI returned showing the following:  Subcentimeter areas of T1 shortening/enhancement in the RIGHT frontal lobe and RIGHT temporal lobe are favored to represent metastatic disease; by history the patient has undergone SRS, but the location of the treated lesions is not known at the time of dictation. Recommend correlation with previous imaging studies or their reports.  Extreme ventriculomegaly involving the lateral, third, and fourth ventricles, consistent with communicating hydrocephalus. No features to suggest leptomeningeal spread of tumor.  No visible osseous disease.  He was last seen on 03/22/2018 when he presented with a report of gait instability.  A referral was made for physical therapy.  He presents today with a report that he has had several episodes of hemoptysis since last evening.  He is concerned that he had previously had similar episodes when he was diagnosed with pneumonia.  He denies fevers, chills, sweats, shortness of breath, or chest pain.  He had contacted our office earlier today at which time triage had ordered a chest x-ray.  He reports that he feels some fullness in his left neck and is concerned that this could be a progression of his neuroendocrine tumor.  Medications: I have reviewed the patient's current medications.  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  No family history on file.  Social History   Socioeconomic History   Marital status: Significant Other    Spouse name: Not on file   Number of children: Not on file   Years of education: Not on file   Highest education level: Not on file    Occupational History   Not on file  Social Needs   Financial resource strain: Not on file   Food insecurity:    Worry: Not on file    Inability: Not on file   Transportation needs:    Medical: Not on file    Non-medical: Not on file  Tobacco Use   Smoking status: Never Smoker  Substance and Sexual Activity   Alcohol use: Not on file   Drug use: Not on file   Sexual activity: Not on file  Lifestyle   Physical activity:    Days per week: Not on file    Minutes per session: Not on file   Stress: Not on file  Relationships   Social connections:    Talks on phone: Not on file    Gets together: Not on file    Attends religious service: Not on file    Active member of club or organization: Not on file    Attends meetings of clubs or organizations: Not on file    Relationship status: Not on file   Intimate partner violence:    Fear of current or ex partner: Not on file    Emotionally abused: Not on file    Physically abused: Not on file    Forced sexual activity: Not on file  Other Topics Concern   Not on file  Social History Narrative   Not on file    Past Medical History, Surgical history, Social history, and Family history were reviewed and updated as appropriate.   Please see review of systems for further details on the patient's review from today.   Review of Systems:  Review of Systems  Constitutional: Negative for chills, diaphoresis, fatigue and fever.  HENT: Negative for congestion, postnasal drip, rhinorrhea, sore throat, trouble swallowing and voice change.        Left neck fullness  Respiratory: Negative for cough, chest tightness, shortness of breath and wheezing.        3-4 episodes of hemoptysis since last evening.  Cardiovascular: Negative for chest pain and palpitations.  Gastrointestinal: Negative for abdominal pain, constipation, diarrhea, nausea and vomiting.  Musculoskeletal: Negative for back pain and myalgias.  Neurological:  Negative for dizziness, light-headedness and headaches.    Objective:   Physical Exam:  BP (!) 145/87 (BP Location: Right Arm, Patient Position: Sitting)    Pulse 82    Temp (!) 97.5 F (36.4 C) (Oral)    Resp 17    Ht 6' 3.5" (1.918 m)    Wt 257 lb 1.6 oz (116.6 kg)    SpO2 95%    BMI 31.71 kg/m  ECOG: 1  Physical Exam Constitutional:      General: He is not in acute distress.    Appearance: He is not diaphoretic.  HENT:     Head: Normocephalic and atraumatic.     Right Ear: External ear normal.     Left Ear: External ear normal.     Mouth/Throat:     Pharynx: No oropharyngeal exudate.  Neck:     Musculoskeletal: Normal  range of motion.     Comments: Mild fullness of the left neck. Cardiovascular:     Rate and Rhythm: Normal rate and regular rhythm.     Heart sounds: Normal heart sounds. No murmur. No friction rub. No gallop.   Pulmonary:     Effort: Pulmonary effort is normal. No respiratory distress.     Breath sounds: Normal breath sounds. No wheezing or rales.  Lymphadenopathy:     Cervical: No cervical adenopathy.  Skin:    General: Skin is warm and dry.     Findings: No erythema or rash.  Neurological:     Mental Status: He is alert.     Coordination: Coordination normal.     Gait: Gait normal.     Lab Review:     Component Value Date/Time   NA 138 12/25/2017 1402   K 4.6 12/25/2017 1402   CL 103 12/25/2017 1402   CO2 25 12/25/2017 1402   GLUCOSE 102 (H) 12/25/2017 1402   BUN 15 12/25/2017 1402   CREATININE 1.00 02/15/2018 1530   CREATININE 1.03 12/25/2017 1402   CALCIUM 9.8 12/25/2017 1402   PROT 7.2 12/25/2017 1402   ALBUMIN 3.8 12/25/2017 1402   AST 17 12/25/2017 1402   ALT 16 12/25/2017 1402   ALKPHOS 102 12/25/2017 1402   BILITOT 0.8 12/25/2017 1402   GFRNONAA >60 12/25/2017 1402   GFRAA >60 12/25/2017 1402       Component Value Date/Time   WBC 4.1 03/26/2018 1303   RBC 4.84 03/26/2018 1303   HGB 14.6 03/26/2018 1303   HCT 44.8  03/26/2018 1303   PLT 221 03/26/2018 1303   MCV 92.6 03/26/2018 1303   MCH 30.2 03/26/2018 1303   MCHC 32.6 03/26/2018 1303   RDW 13.8 03/26/2018 1303   LYMPHSABS 0.7 03/26/2018 1303   MONOABS 0.3 03/26/2018 1303   EOSABS 0.2 03/26/2018 1303   BASOSABS 0.0 03/26/2018 1303   -------------------------------  Imaging from last 24 hours (if applicable):  Radiology interpretation: Ct Soft Tissue Neck W Contrast  Result Date: 03/27/2018 CLINICAL DATA:  63 year old male with metastatic neuroendocrine cancer. Chemotherapy in 2019. Ongoing immunotherapy. Neck mass. A palpable right side area of concern was marked on the scan. EXAM: CT NECK WITH CONTRAST TECHNIQUE: Multidetector CT imaging of the neck was performed using the standard protocol following the bolus administration of intravenous contrast. CONTRAST:  3mL OMNIPAQUE IOHEXOL 300 MG/ML  SOLN COMPARISON:  Brain MRI 02/15/2018. CT Chest, Abdomen, and Pelvis 01/26/2018. FINDINGS: Pharynx and larynx: Negative larynx. Pharyngeal soft tissue contours are within normal limits. Negative parapharyngeal and retropharyngeal spaces. Salivary glands: Negative sublingual space. Submandibular glands are within normal limits. Parotid glands appear symmetric and within normal limits. Thyroid: Negative. Lymph nodes: No cervical lymphadenopathy, bilateral cervical lymph nodes are diminutive. There is mild soft tissue stranding in the bilateral neck along some of the nodal stations (on the right series 2, image 58). The left level 1 nodes are asymmetrically larger, but remain normal by size criteria measuring 6-7 millimeter short axis and with some fatty hilum visible. A marked area of clinical concern is on series 2, image 62 overlying the right external jugular vein at the lateral neck. No regional soft tissue mass or lymphadenopathy. No soft tissue mass identified in the neck. Vascular: Major vascular structures in the neck and at the skull base are patent. There is  a partially retropharyngeal course of right ICA, normal variant. No carotid bifurcation atherosclerosis. Limited intracranial: Partially visible ventriculomegaly appears stable  from the brain MRI last month. Visualized orbits: Negative. Mastoids and visualized paranasal sinuses: Hyperplastic and well pneumatized. Skeleton: Cervical spine degeneration. Heterogeneous bone mineralization. No acute or suspicious osseous lesion identified. Upper chest: No lymphadenopathy in the visible superior mediastinum. Partially visible right upper lobe lung nodule(s) on series 5, image 45 today with lower lung volumes and respiratory motion. IMPRESSION: 1. No soft tissue mass or abnormality to correspond to the marked area of palpable concern at the right neck which overlies the right EJ. No lymphadenopathy or metastatic disease identified in the neck. 2. Partially visible right upper lobe lung nodule(s), and partially visible ventriculomegaly as see last month by chest CT and brain MRI. Electronically Signed   By: Genevie Ann M.D.   On: 03/27/2018 11:34        This patient was seen with Dr. Julien Nordmann with my treatment plan reviewed with him. He expressed agreement with my medical management of this patient.  ADDENDUM: Hematology/Oncology Attending: I had a face-to-face encounter with the patient.  I recommended his care plan.  This is a very pleasant 63 years old white male with metastatic neuroendocrine carcinoma initially thought to be metastatic from the right tonsil and the patient had multiple metastatic disease in the lung, mediastinal lymph nodes as well as bone, brain and adrenal glands.  He was treated in the past with systemic chemotherapy with cisplatin and etoposide as well as immunotherapy with ipilimumab and nivolumab and most recently with Tecentriq and a Avastin.  He is currently off treatment and has been doing fine.  He presented for evaluation of few episodes of hemoptysis.  He also has some gait instability  and swelling on the right side of the neck. I recommended for the patient to have CT scan of the neck as well as chest x-ray to rule out any disease progression. I will see the patient back for follow-up visit next week for evaluation and discussion of the scan results and further recommendation regarding treatment of his condition. For the gait instability, he will be referred to physical therapy for evaluation and management. The patient was advised to call immediately if he has any concerning symptoms in the interval.  Disclaimer: This note was dictated with voice recognition software. Similar sounding words can inadvertently be transcribed and may be missed upon review. Eilleen Kempf, MD 03/29/18

## 2018-04-03 ENCOUNTER — Inpatient Hospital Stay (HOSPITAL_BASED_OUTPATIENT_CLINIC_OR_DEPARTMENT_OTHER): Payer: PRIVATE HEALTH INSURANCE | Admitting: Internal Medicine

## 2018-04-03 ENCOUNTER — Other Ambulatory Visit: Payer: Self-pay

## 2018-04-03 VITALS — BP 122/87 | HR 92 | Temp 98.5°F | Resp 18 | Ht 75.0 in | Wt 258.3 lb

## 2018-04-03 DIAGNOSIS — C7951 Secondary malignant neoplasm of bone: Secondary | ICD-10-CM

## 2018-04-03 DIAGNOSIS — C7800 Secondary malignant neoplasm of unspecified lung: Secondary | ICD-10-CM

## 2018-04-03 DIAGNOSIS — C7B8 Other secondary neuroendocrine tumors: Secondary | ICD-10-CM | POA: Diagnosis not present

## 2018-04-03 DIAGNOSIS — C3490 Malignant neoplasm of unspecified part of unspecified bronchus or lung: Secondary | ICD-10-CM

## 2018-04-03 DIAGNOSIS — C7A8 Other malignant neuroendocrine tumors: Secondary | ICD-10-CM

## 2018-04-03 DIAGNOSIS — Z9221 Personal history of antineoplastic chemotherapy: Secondary | ICD-10-CM

## 2018-04-03 DIAGNOSIS — C7931 Secondary malignant neoplasm of brain: Secondary | ICD-10-CM

## 2018-04-03 NOTE — Progress Notes (Signed)
[place] Telephone:([phone]) [phone]   Fax:(336) [phone]  OFFICE PROGRESS NOTE  Patient, No Pcp Per No address on file  DIAGNOSIS: metastatic neuroendocrine carcinoma initially thought to be metastatic disease from the right tonsil but the patient also has multiple metastatic disease in the lung mediastinal lymph nodes as well as now bone, brain and adrenal glands.   PRIOR THERAPY: He was treated with several chemotherapy regimens including cisplatin and etoposide as well as immunotherapy with ipilimumab and nivolumab and most recently with Tecentriq and Avastin.  Unfortunately the patient continues to have evidence for disease progression.  CURRENT THERAPY: None.  INTERVAL HISTORY: Dwayne Massey 63 y.o. male returns to the clinic today for follow-up visit.  The patient is feeling fine today with no concerning complaints except for occasional spitting of blood from his mouth.  These have been few times but not on daily basis.  He denied having any current chest pain, shortness of breath, cough or hemoptysis.  He denied having any fever or chills.  He has no nausea, vomiting, diarrhea or constipation.  He denied having any headache or visual changes.  He had CT scan of the neck performed recently for evaluation of a swelling on the right side of the neck and he is here for discussion of his scan results and recommendation regarding his condition.  MEDICAL HISTORY: Past Medical History:  Diagnosis Date  . 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 Chemotherapy induced nausea and vomiting   . Chemotherapy induced neutropenia ([place])   . Herpes zoster   . Meningitis   . Metastatic cancer to brain [facility])     ALLERGIES:  has No Known Allergies.  MEDICATIONS:  No current outpatient medications on file.   No current facility-administered medications for this visit.     SURGICAL HISTORY: No past surgical history on file.  REVIEW OF SYSTEMS:  A comprehensive review of systems was  negative except for: Constitutional: positive for fatigue   PHYSICAL EXAMINATION: General appearance: alert, cooperative and no distress Head: Normocephalic, without obvious abnormality, atraumatic Neck: no adenopathy, no JVD, supple, symmetrical, trachea midline and thyroid not enlarged, symmetric, no tenderness/mass/nodules Lymph nodes: Cervical, supraclavicular, and axillary nodes normal. Resp: clear to auscultation bilaterally Back: symmetric, no curvature. ROM normal. No CVA tenderness. Cardio: regular rate and rhythm, S1, S2 normal, no murmur, click, rub or gallop GI: soft, non-tender; bowel sounds normal; no masses,  no organomegaly Extremities: extremities normal, atraumatic, no cyanosis or edema  ECOG PERFORMANCE STATUS: 1 - Symptomatic but completely ambulatory  Blood pressure 122/87, pulse 92, temperature 98.5 F (36.9 C), temperature source Oral, resp. rate 18, height 6\' 3"  (1.905 m), weight 258 lb 4.8 oz (117.2 kg), SpO2 98 %.  LABORATORY DATA: Lab Results  Component Value Date   WBC 4.1 03/26/2018   HGB 14.6 03/26/2018   HCT 44.8 03/26/2018   MCV 92.6 03/26/2018   PLT 221 03/26/2018      Chemistry      Component Value Date/Time   NA 138 12/25/2017 1402   K 4.6 12/25/2017 1402   CL 103 12/25/2017 1402   CO2 25 12/25/2017 1402   BUN 15 12/25/2017 1402   CREATININE 1.00 02/15/2018 1530   CREATININE 1.03 12/25/2017 1402      Component Value Date/Time   CALCIUM 9.8 12/25/2017 1402   ALKPHOS 102 12/25/2017 1402   AST 17 12/25/2017 1402   ALT 16 12/25/2017 1402   BILITOT 0.8 12/25/2017 1402  RADIOGRAPHIC STUDIES: Ct Soft Tissue Neck W Contrast  Result Date: 03/27/2018 CLINICAL DATA:  63 year old male with metastatic neuroendocrine cancer. Chemotherapy in 2019. Ongoing immunotherapy. Neck mass. A palpable right side area of concern was marked on the scan. EXAM: CT NECK WITH CONTRAST TECHNIQUE: Multidetector CT imaging of the neck was performed using the  standard protocol following the bolus administration of intravenous contrast. CONTRAST:  83mL OMNIPAQUE IOHEXOL 300 MG/ML  SOLN COMPARISON:  Brain MRI 02/15/2018. CT Chest, Abdomen, and Pelvis 01/26/2018. FINDINGS: Pharynx and larynx: Negative larynx. Pharyngeal soft tissue contours are within normal limits. Negative parapharyngeal and retropharyngeal spaces. Salivary glands: Negative sublingual space. Submandibular glands are within normal limits. Parotid glands appear symmetric and within normal limits. Thyroid: Negative. Lymph nodes: No cervical lymphadenopathy, bilateral cervical lymph nodes are diminutive. There is mild soft tissue stranding in the bilateral neck along some of the nodal stations (on the right series 2, image 58). The left level 1 nodes are asymmetrically larger, but remain normal by size criteria measuring 6-7 millimeter short axis and with some fatty hilum visible. A marked area of clinical concern is on series 2, image 62 overlying the right external jugular vein at the lateral neck. No regional soft tissue mass or lymphadenopathy. No soft tissue mass identified in the neck. Vascular: Major vascular structures in the neck and at the skull base are patent. There is a partially retropharyngeal course of right ICA, normal variant. No carotid bifurcation atherosclerosis. Limited intracranial: Partially visible ventriculomegaly appears stable from the brain MRI last month. Visualized orbits: Negative. Mastoids and visualized paranasal sinuses: Hyperplastic and well pneumatized. Skeleton: Cervical spine degeneration. Heterogeneous bone mineralization. No acute or suspicious osseous lesion identified. Upper chest: No lymphadenopathy in the visible superior mediastinum. Partially visible right upper lobe lung nodule(s) on series 5, image 45 today with lower lung volumes and respiratory motion. IMPRESSION: 1. No soft tissue mass or abnormality to correspond to the marked area of palpable concern at  the right neck which overlies the right EJ. No lymphadenopathy or metastatic disease identified in the neck. 2. Partially visible right upper lobe lung nodule(s), and partially visible ventriculomegaly as see last month by chest CT and brain MRI. Electronically Signed   By: Genevie Ann M.D.   On: 03/27/2018 11:34    ASSESSMENT AND PLAN: This is a very pleasant 63 years old white male with metastatic neuroendocrine carcinoma with metastatic disease from the right tonsil with multiple metastasis in the lung, mediastinal lymph nodes as well as bone, brain and adrenal glands.  The patient was treated in the past with ipilimumab and nivolumab followed by a Avastin and Tecentriq.  He is currently on observation and doing fine with no concerning complaints. His repeat CT scan of the neck showed no concerning findings. I discussed the scan results and reviewed the images of the scan of the brain as well as neck and chest with the patient today. He is currently asymptomatic except for occasional spitting of blood from his mouth.  His oral exam is unremarkable today. I recommended for the patient to continue on observation and he has repeat CT scan of the chest abdomen pelvis in early May 2020. For the metastatic brain lesion, I referred the patient to radiation oncology for evaluation.  For the oral hemorrhage, I advised the patient to use salt water mouthwash and also to consult with a dentist if needed. The patient was also advised to call immediately if he has any concerning symptoms in the interval.  The patient voices understanding of current disease status and treatment options and is in agreement with the current care plan.  All questions were answered. The patient knows to call the clinic with any problems, questions or concerns. We can certainly see the patient much sooner if necessary.  Disclaimer: This note was dictated with voice recognition software. Similar sounding words can inadvertently be  transcribed and may not be corrected upon review.

## 2018-04-04 ENCOUNTER — Telehealth: Payer: Self-pay | Admitting: Internal Medicine

## 2018-04-04 NOTE — Telephone Encounter (Signed)
Patient already on schedule as requested per 3/24 los. Referral to radonc sent via [facility].

## 2018-04-16 NOTE — Progress Notes (Signed)
DIAGNOSIS: metastatic neuroendocrine carcinoma initially thought to be metastatic disease from the right tonsil but the patient also has multiple metastatic disease in the lung mediastinal lymph nodes as well as now bone, brain and adrenal glands.   Location/Histology of Brain Tumor: Per MRI Brain W WO Contrast 02/15/18:  IMPRESSION: Subcentimeter areas of T1 shortening/enhancement in the RIGHT frontal lobe and RIGHT temporal lobe are favored to represent metastatic disease; by history the patient has undergone SRS, but the location of the treated lesions is not known at the time of dictation. Recommend correlation with previous imaging studies or their reports.  Extreme ventriculomegaly involving the lateral, third, and fourth ventricles, consistent with communicating hydrocephalus. No features to suggest leptomeningeal spread of tumor.  No visible osseous disease.  Patient presented with symptoms of:  The patient is feeling fine today with no concerning complaints except for occasional spitting of blood from his mouth.  These have been few times but not on daily basis.    Past or anticipated interventions, if any, per neurosurgery: None at this time  Past or anticipated interventions, if any, per medical oncology: Per Dr. Julien Nordmann 04/03/18:  ASSESSMENT AND PLAN: This is a very pleasant 63 years old white male with metastatic neuroendocrine carcinoma with metastatic disease from the right tonsil with multiple metastasis in the lung, mediastinal lymph nodes as well as bone, brain and adrenal glands.  The patient was treated in the past with ipilimumab and nivolumab followed by a Avastin and Tecentriq.  He is currently on observation and doing fine with no concerning complaints. His repeat CT scan of the neck showed no concerning findings. I discussed the scan results and reviewed the images of the scan of the brain as well as neck and chest with the patient today. He is currently asymptomatic  except for occasional spitting of blood from his mouth.  His oral exam is unremarkable today. I recommended for the patient to continue on observation and he has repeat CT scan of the chest abdomen pelvis in early May 2020. For the metastatic brain lesion, I referred the patient to radiation oncology for evaluation.  For the oral hemorrhage, I advised the patient to use salt water mouthwash and also to consult with a dentist if needed. The patient was also advised to call immediately if he has any concerning symptoms in the interval. The patient voices understanding of current disease status and treatment options and is in agreement with the current care plan.  All questions were answered. The patient knows to call the clinic with any problems, questions or concerns. We can certainly see the patient much sooner if necessary.  Dose of Decadron, if applicable: No  Recent neurologic symptoms, if any:   Seizures: no  Headaches: no  Nausea: no  Dizziness/ataxia: no  Difficulty with hand coordination: no  Focal numbness/weakness: yes, left hand but pt attributes to over-exercise  Visual deficits/changes: no  Confusion/Memory deficits: no  Painful bone metastases at present, if any: None at this time.  SAFETY ISSUES:  Prior radiation? Pt has had radiation to RIGHT lung and SRS to brain. Both in [place]. Pt may have had radiation to LEFT lung, he is uncertain.  Pacemaker/ICD? No  Possible current pregnancy? N/A  Is the patient on methotrexate? No  Additional Complaints / other details: Pt presents today for initial consult with Dr. Sondra Come for Radiation Oncology.   BP (!) 139/96 (BP Location: Right Arm, Patient Position: Sitting)   Pulse 79   Temp (!) 97.5  F (36.4 C) (Oral)   Resp 20   Ht 6\' 3"  (1.905 m)   Wt 257 lb 6.4 oz (116.8 kg)   SpO2 96%   BMI 32.17 kg/m   Wt Readings from Last 3 Encounters:  04/18/18 257 lb 6.4 oz (116.8 kg)  04/03/18 258 lb 4.8 oz (117.2 kg)   03/26/18 257 lb 1.6 oz (116.6 kg)   Loma Sousa, RN BSN

## 2018-04-18 ENCOUNTER — Other Ambulatory Visit: Payer: Self-pay | Admitting: Radiation Therapy

## 2018-04-18 ENCOUNTER — Other Ambulatory Visit: Payer: Self-pay

## 2018-04-18 ENCOUNTER — Ambulatory Visit
Admission: RE | Admit: 2018-04-18 | Discharge: 2018-04-18 | Disposition: A | Discharge status: Home / Self Care | Payer: PRIVATE HEALTH INSURANCE | Source: Ambulatory Visit | Attending: Radiation Oncology | Admitting: Radiation Oncology

## 2018-04-18 DIAGNOSIS — C7951 Secondary malignant neoplasm of bone: Secondary | ICD-10-CM | POA: Insufficient documentation

## 2018-04-18 DIAGNOSIS — C7931 Secondary malignant neoplasm of brain: Secondary | ICD-10-CM | POA: Insufficient documentation

## 2018-04-18 DIAGNOSIS — C7970 Secondary malignant neoplasm of unspecified adrenal gland: Secondary | ICD-10-CM | POA: Diagnosis not present

## 2018-04-18 DIAGNOSIS — C099 Malignant neoplasm of tonsil, unspecified: Secondary | ICD-10-CM | POA: Diagnosis present

## 2018-04-18 DIAGNOSIS — C7949 Secondary malignant neoplasm of other parts of nervous system: Principal | ICD-10-CM

## 2018-04-18 DIAGNOSIS — C7A090 Malignant carcinoid tumor of the bronchus and lung: Secondary | ICD-10-CM | POA: Insufficient documentation

## 2018-04-18 DIAGNOSIS — C7B8 Other secondary neuroendocrine tumors: Secondary | ICD-10-CM

## 2018-04-18 DIAGNOSIS — C7A8 Other malignant neuroendocrine tumors: Secondary | ICD-10-CM

## 2018-04-18 NOTE — Patient Instructions (Signed)
Coronavirus (COVID-19) Are you at risk?  Are you at risk for the Coronavirus (COVID-19)?  To be considered HIGH RISK for Coronavirus (COVID-19), you have to meet the following criteria:  . Traveled to [place], [place], [place], [place] or [place]; or in the [place] to [place], [place], [place], or [place]; and have fever, cough, and shortness of breath within the last 2 weeks of travel OR . Been in close contact with a person diagnosed with COVID-19 within the last 2 weeks and have fever, cough, and shortness of breath . IF YOU DO NOT MEET THESE CRITERIA, YOU ARE CONSIDERED LOW RISK FOR COVID-19.  What to do if you are HIGH RISK for COVID-19?  . If you are having a medical emergency, call 911. . Seek medical care right away. Before you go to a doctor's office, urgent care or emergency department, call ahead and tell them about your recent travel, contact with someone diagnosed with COVID-19, and your symptoms. You should receive instructions from your physician's office regarding next steps of care.  . When you arrive at healthcare provider, tell the healthcare staff immediately you have returned from visiting [place], [place], [place], [place] or [place]; or traveled in the [place] to [place], [place], [place], or [place]; in the last two weeks or you have been in close contact with a person diagnosed with COVID-19 in the last 2 weeks.   . Tell the health care staff about your symptoms: fever, cough and shortness of breath. . After you have been seen by a medical provider, you will be either: o Tested for (COVID-19) and discharged home on quarantine except to seek medical care if symptoms worsen, and asked to  - Stay home and avoid contact with others until you get your results (4-5 days)  - Avoid travel on public transportation if possible (such as bus, train, or airplane) or o Sent to the Emergency Department by EMS for evaluation, COVID-19 testing, and possible  admission depending on your condition and test results.  What to do if you are LOW RISK for COVID-19?  Reduce your risk of any infection by using the same precautions used for avoiding the common cold or flu:  . Wash your hands often with soap and warm water for at least 20 seconds.  If soap and water are not readily available, use an alcohol-based hand sanitizer with at least 60% alcohol.  . If coughing or sneezing, cover your mouth and nose by coughing or sneezing into the elbow areas of your shirt or coat, into a tissue or into your sleeve (not your hands). . Avoid shaking hands with others and consider head nods or verbal greetings only. . Avoid touching your eyes, nose, or mouth with unwashed hands.  . Avoid close contact with people who are sick. . Avoid places or events with large numbers of people in one location, like concerts or sporting events. . Carefully consider travel plans you have or are making. . If you are planning any travel outside or inside the [place], visit the CDC's Travelers' Health webpage for the latest health notices. . If you have some symptoms but not all symptoms, continue to monitor at home and seek medical attention if your symptoms worsen. . If you are having a medical emergency, call 911.   ADDITIONAL HEALTHCARE OPTIONS FOR PATIENTS  [facility] / e-Visit: [email or web address]         [facility]: [phone]  [facility]: [phone]                   [facility]: [phone]   

## 2018-04-20 ENCOUNTER — Other Ambulatory Visit: Payer: Self-pay | Admitting: Radiation Oncology

## 2018-04-20 NOTE — Progress Notes (Signed)
error 

## 2018-04-23 ENCOUNTER — Telehealth: Payer: Self-pay | Admitting: *Deleted

## 2018-04-23 NOTE — Telephone Encounter (Signed)
Requested CD of brain imaging from February 2019 to current from [facility].

## 2018-04-23 NOTE — Progress Notes (Signed)
Location/Histology of Brain Tumor:  Subcentimeter areas of T1 shortening/enhancement in the RIGHT frontal lobe and RIGHT temporal lobe are favored to represent  Patient presented with symptoms of:  None regarding his brain metastasis.   Past or anticipated interventions, if any, per neurosurgery: None currently, tumor board 04/30/18  Past or anticipated interventions, if any, per medical oncology:  04/03/18 Dr. Julien Nordmann: ASSESSMENT AND PLAN: This is a very pleasant 63 years old white male with metastatic neuroendocrine carcinoma with metastatic disease from the right tonsil with multiple metastasis in the lung, mediastinal lymph nodes as well as bone, brain and adrenal glands.  The patient was treated in the past with ipilimumab and nivolumab followed by a Avastin and Tecentriq.  He is currently on observation and doing fine with no concerning complaints. His repeat CT scan of the neck showed no concerning findings. I discussed the scan results and reviewed the images of the scan of the brain as well as neck and chest with the patient today. He is currently asymptomatic except for occasional spitting of blood from his mouth.  His oral exam is unremarkable today. I recommended for the patient to continue on observation and he has repeat CT scan of the chest abdomen pelvis in early May 2020. For the metastatic brain lesion, I referred the patient to radiation oncology for evaluation.  For the oral hemorrhage, I advised the patient to use salt water mouthwash and also to consult with a dentist if needed. The patient was also advised to call immediately if he has any concerning symptoms in the interval. The patient voices understanding of current disease status and treatment options and is in agreement with the current care plan.  Dose of Decadron, if applicable: N./A  Recent neurologic symptoms, if any:   Seizures: No  Headaches: No  Nausea: No  Dizziness/ataxia: No  Difficulty with hand  coordination: No  Focal numbness/weakness: No  Visual deficits/changes: No  Confusion/Memory deficits: No  Painful bone metastases at present, if any: No  SAFETY ISSUES:  Prior radiation? Yes,  11/19/14- 01/06/15- Oropharynx, neck IMRT 6,600cGy 33 fractions. Dose 200 cGy.  Pt has had radiation to RIGHT lung in [place]. He is not sure if he had radiation to his lung.  04/06/17- SRS right frontal, 2,000 Gy in 1 fraction Left parital SRS 2,000 Gy Right temporal SRS 2,000 Gy    Pacemaker/ICD? No  Possible current pregnancy? No  Is the patient on methotrexate? No  Additional Complaints / other details:  Dr. Sondra Come saw as a consult on 04/18/18: IMPRESSION: metastatic neuroendocrine carcinoma initially thought to be metastatic disease from the right tonsil but the patient also has multiple metastatic disease in the lung mediastinal lymph nodes as well as now bone, brain and adrenal glands.  Recent brian MRI shows two small lesions concerning for metastatic disease. The patient would likely be a candidate for [facility] treatment for these lesions. He will be setup with the [facility] team for evaluation, potential treatment and follow-up. Pt met briefly with the [facility] coordinator and he will be setup for 3T MRI.  PLAN: 3T MRI then evaluation in the multidisciplinary brain conference for potential treatment.

## 2018-04-25 ENCOUNTER — Ambulatory Visit
Admission: RE | Admit: 2018-04-25 | Discharge: 2018-04-25 | Disposition: A | Discharge status: Home / Self Care | Payer: Self-pay | Source: Ambulatory Visit | Attending: Radiation Oncology | Admitting: Radiation Oncology

## 2018-04-25 ENCOUNTER — Other Ambulatory Visit: Payer: Self-pay | Admitting: Radiation Oncology

## 2018-04-25 DIAGNOSIS — C719 Malignant neoplasm of brain, unspecified: Secondary | ICD-10-CM

## 2018-04-27 ENCOUNTER — Other Ambulatory Visit: Payer: Self-pay

## 2018-04-27 ENCOUNTER — Ambulatory Visit
Admission: RE | Admit: 2018-04-27 | Discharge: 2018-04-27 | Disposition: A | Discharge status: Home / Self Care | Payer: BLUE CROSS/BLUE SHIELD | Source: Ambulatory Visit | Attending: Radiation Oncology | Admitting: Radiation Oncology

## 2018-04-27 ENCOUNTER — Other Ambulatory Visit: Payer: No Typology Code available for payment source

## 2018-04-27 ENCOUNTER — Other Ambulatory Visit: Payer: Self-pay | Admitting: Radiation Oncology

## 2018-04-27 DIAGNOSIS — C7931 Secondary malignant neoplasm of brain: Secondary | ICD-10-CM

## 2018-04-27 DIAGNOSIS — C7949 Secondary malignant neoplasm of other parts of nervous system: Principal | ICD-10-CM

## 2018-04-27 MED ORDER — GADOBENATE DIMEGLUMINE 529 MG/ML IV SOLN
20.0000 mL | Freq: Once | INTRAVENOUS | Status: AC | PRN
  Administered 2018-04-27: 20 mL via INTRAVENOUS

## 2018-04-30 NOTE — Progress Notes (Addendum)
Radiation Oncology         [phone] ________________________________  Initial telemedicine outpatient Consultation  Name: Dwayne Massey MRN: [id number]  Date: 05/01/2018  DOB: 1955/09/08  WN:IOEVOJJ, No Pcp Per  Curt Bears, MD   REFERRING PHYSICIAN: Curt Bears, MD  DIAGNOSIS:     ICD-10-CM   1. Brain metastases (HCC) C79.31 LORazepam (ATIVAN) 0.5 MG tablet   Stage IV neuroendocrine carcinoma of the tonsil metastatic to brain   HISTORY OF PRESENT ILLNESS:Dwayne Massey is a 63 y.o. male who moved to [facility] from [place] in 11/2017.  This encounter was provided by telemedicine platform Facetime, as he was unable to access WebEx.  The patient has given verbal consent for this type of encounter and has been advised to only accept a meeting of this type in a secure network environment. He understands FaceTime is not a HIPAA approved form of telemedicine and consented to its use for communication.  In 10/2014 the patient was diagnosed with metastatic neuroendocrine carcinoma. He presented with a lump on the left side of his neck. Excisional biopsy of the left cervical lymph node showed neuroendocrine tumor of the right tonsil with metastasis to the cervical lymph nodes. He received concurrent chemoradiation with cisplatin and etoposide. PET scan in 12/2015 revealed metastasis to the lungs and bones. He proceeded to right VATS with wedge resection of right lower lobe, with pathology confirming neuroendocrine neoplasm. He underwent another round of cisplatin and etoposide. Repeat PET scan in 05/2016 for right shoulder pain and left lower extremity weakness showed progressive mediastinal disease and new lung nodules. He was treated with combination immunotherapy with ipilimumab and nivolumab, but he subsequently experienced a terrible reaction and was given only nivolumab single agent after cycle 1.   In 01/2017, the patient experienced slurred speech as well as weakness and numbness involving the  right side. MRI of the brain showed metastatic disease, which was then treated with single-dose SRS to three sites-- right frontal, left parietal, and right temporal-- completed 03/2017. Unfortunately, subsequent scans continued to show disease progression, with progressive lung nodule and new retroperitoneal lymph node and adrenal metastasis. He was treated with tecentriq and avastin starting around the end of 06/2017 through 10/2017.  He then established care here in [place] with Dr. Julien Nordmann on 12/25/2017. The patient opted to proceed with observation due to being asymptomatic. He will undergo repeat chest/abdomen/pelvis CT in early 05/2018.  Unfortunately, staging MRI of the brain on 02/15/2018 demonstrated subcentimeter areas of T1 shortening/enhancement in the right frontal and temporal lobes are favored to represent metastatic disease, no visible osseous disease.  The patient was then referred to tumor board and scheduled for repeat brain MRI (3 Telsa, SRS protocol). This MRI took place on 04/27/2018 and revealed: 8 total lesions; new lesion in the left parietal brain measuring 6 mm; new/enlarged lesion at the left temporal tip measuring 9 mm with mild edema; increased solid enhancement/enlargement of a previously treated left parietal lesion now measuring 6 mm; increased size of previously treated lesions of the right lateral temporal lobe and right frontal white matter could be true progression or pseudo progression, pronounced increased edema associated with the right frontal lesion favors treatment effect; two stable lesions in the right cerebellum, punctate stable lesion in the medial anterior left temporal lobe.   His case was discussed in tumor board on 04/30/2018.  Dose of Decadron, if applicable: N./A  Recent neurologic symptoms, if any:   Seizures: No  Headaches: No  Nausea: No  Dizziness/ataxia:  No  Difficulty with hand coordination: No  Focal numbness/weakness: No  Visual  deficits/changes: No  Confusion/Memory deficits: No    PREVIOUS RADIATION THERAPY: Yes  04/06/2017: Brain (3 targets), SRS / 20 Gy to each site in 1 fraction 11/19/2014 - 01/06/2015: Oropharynx/Neck, IMRT / 66 Gy in 33 fractions (Dr. Lucretia Field)  PAST MEDICAL HISTORY:  has a past medical history of Cancer of tonsil ([place]), Chemotherapy induced nausea and vomiting, Chemotherapy induced neutropenia ([place]), Herpes zoster, Meningitis, and Metastatic cancer to brain [facility]).    PAST SURGICAL HISTORY: Past Surgical History:  Procedure Laterality Date   KNEE SURGERY     LUNG SURGERY Right    for neuroendocrine tumor in [place]   PEG PLACEMENT      FAMILY HISTORY: family history is not on file.  SOCIAL HISTORY:  reports that he has never smoked. He has never used smokeless tobacco. He reports that he does not use drugs.  ALLERGIES: Patient has no known allergies.  MEDICATIONS:  Current Outpatient Medications  Medication Sig Dispense Refill   LORazepam (ATIVAN) 0.5 MG tablet Take 1-2 tablets PO about 20-30 minutes before wearing radiosurgery mask to help with anxiety. Do not drive while on this. 4 tablet 0   No current facility-administered medications for this encounter.    Facility-Administered Medications Ordered in Other Encounters  Medication Dose Route Frequency Provider Last Rate Last Dose   sodium chloride flush (NS) 0.9 % injection 10 mL  10 mL Intravenous Once Eppie Gibson, MD        REVIEW OF SYSTEMS:   As above  PHYSICAL EXAM:  vitals were not taken for this visit.   NAD  LABORATORY DATA:  Lab Results  Component Value Date   WBC 4.1 03/26/2018   HGB 14.6 03/26/2018   HCT 44.8 03/26/2018   MCV 92.6 03/26/2018   PLT 221 03/26/2018   CMP     Component Value Date/Time   NA 138 12/25/2017 1402   K 4.6 12/25/2017 1402   CL 103 12/25/2017 1402   CO2 25 12/25/2017 1402   GLUCOSE 102 (H) 12/25/2017 1402   BUN 14 05/02/2018 1056   CREATININE 0.98 05/02/2018 1056    CALCIUM 9.8 12/25/2017 1402   PROT 7.2 12/25/2017 1402   ALBUMIN 3.8 12/25/2017 1402   AST 17 12/25/2017 1402   ALT 16 12/25/2017 1402   ALKPHOS 102 12/25/2017 1402   BILITOT 0.8 12/25/2017 1402   GFRNONAA >60 05/02/2018 1056   GFRAA >60 05/02/2018 1056         RADIOGRAPHY: Mr Brain W Wo Contrast  Result Date: 04/27/2018 CLINICAL DATA:  Metastatic neuroendocrine cancer.  SRS treatment. EXAM: MRI HEAD WITHOUT AND WITH CONTRAST TECHNIQUE: Multiplanar, multiecho pulse sequences of the brain and surrounding structures were obtained without and with intravenous contrast. CONTRAST:  33mL MULTIHANCE GADOBENATE DIMEGLUMINE 529 MG/ML IV SOLN COMPARISON:  02/15/2018.  09/17/2017. 06/09/2017.  03/23/2017. FINDINGS: Brain: Previously treated lesion in the peripheral right cerebellum approximate axial image 28 is no longer visible. Previously treated lesion higher in the peripheral right cerebellum image 35 measures 3 mm, stable. Treated lesion in the lateral right temporal lobe image 63 measures 12 mm, without significant surrounding edema. Previously this measured maximally 8 mm. This could represent pseudoprogression or true progression. Pseudo progression is favored. Progressive lesion at the left temporal tip measures 9 mm in diameter and is associated with mild edema. Punctate focus of enhancement medial to that is stable from previous studies. Newly seen 6 mm  metastasis in the left parietal lobe image 109 adjacent to the posterior body the lateral ventricle. No edema. Previously treated right frontal lesion adjacent to the anterior body of the right lateral ventricle image 121 is larger, measuring 18 mm, with indistinct margins and pronounced increase in regional edema. This is favored to represent pseudo progression/treatment effect. Previously treated lesion in the superficial left parietal lobe axial image 121 is growing again, 6 mm today as opposed to 3 mm previously. No edema. Chronic ventriculomegaly  is stable.  No sign of acute infarction. Vascular: Major vessels at the base of the brain show flow. Skull and upper cervical spine: Negative Sinuses/Orbits: Clear/normal Other: None IMPRESSION: Eight total lesions identified as noted above and marked on the axial imaging with arrows. New lesion in the left parietal brain axial image 109 measuring 6 mm. New/enlarged lesion at the left temporal tip measuring 9 mm with mild edema. Increased solid enhancement/enlargement of a left parietal previously treated lesion now measuring 6 mm image 121. Increased size of previously treated lesions of the right lateral temporal lobe and right frontal white matter which could be true progression or pseudo progression. Pronounced increased edema associated with the right frontal lesion, favoring treatment effect. Two stable lesions in the right cerebellum. Punctate stable lesion in the medial anterior left temporal lobe. Electronically Signed   By: Nelson Chimes M.D.   On: 04/27/2018 11:32      IMPRESSION/PLAN: This is a very pleasant 63 year old gentleman with metastatic disease to the brain.  I had a lengthy discussion with the patient after reviewing their MRI results with them.  We discussed the history of his disease, his past treatments, and options for future treatment.  After lengthy discussion, the patient would like to proceed with stereotactic brain radiosurgery to their active metastatic disease. They will meet with neurosurgery in the near future to discuss this further; a neurosurgeon will participate in their case.   We discussed the risks, benefits, and side effects of SRS. No guarantees of treatment were given.  The patient was encouraged to ask questions that I answered to the best of my ability.   CT simulation will take place on 05-02-18 and treatment soon after.  I plan to deliver 20 Gy in 1 fraction to the active lesions.  The plan was discussed at CNS tumor board this week, with consensus.    Ativan Rx made due to claustrophobia/anxiety from prior [facility] in [place].    This encounter was provided by telemedicine platform Facetime, as he was unable to access WebEx.  The patient has given verbal consent for this type of encounter and has been advised to only accept a meeting of this type in a secure network environment. He understands FaceTime is not a HIPAA approved form of telemedicine and consented to its use for communication. The time spent during this encounter was 35 minutes. The attendants for this meeting include Eppie Gibson  and Varney Baas.  During the encounter, Eppie Gibson was located at [facility] Radiation Oncology Department.  Dwayne Massey was located at home.     __________________________________________   Eppie Gibson, MD  This document serves as a record of services personally performed by Eppie Gibson, MD. It was created on her behalf by Wilburn Mylar, a trained medical scribe. The creation of this record is based on the scribe's personal observations and the provider's statements to them. This document has been checked and approved by the attending provider.

## 2018-05-01 ENCOUNTER — Encounter: Payer: Self-pay | Admitting: Radiation Oncology

## 2018-05-01 ENCOUNTER — Other Ambulatory Visit: Payer: Self-pay

## 2018-05-01 ENCOUNTER — Ambulatory Visit
Admission: RE | Admit: 2018-05-01 | Discharge: 2018-05-01 | Disposition: A | Discharge status: Home / Self Care | Payer: No Typology Code available for payment source | Source: Ambulatory Visit | Attending: Radiation Oncology | Admitting: Radiation Oncology

## 2018-05-01 DIAGNOSIS — C7949 Secondary malignant neoplasm of other parts of nervous system: Principal | ICD-10-CM

## 2018-05-01 DIAGNOSIS — C7931 Secondary malignant neoplasm of brain: Secondary | ICD-10-CM

## 2018-05-01 MED ORDER — LORAZEPAM 0.5 MG PO TABS: ORAL_TABLET | ORAL | 0 refills | Status: DC

## 2018-05-01 MED FILL — LORazepam 0.5 MG TABS: 0.5 | 2 days supply | Qty: 4 | Fill #0

## 2018-05-01 NOTE — Progress Notes (Signed)
Has armband been applied?  Yes  Does patient have an allergy to IV contrast dye?: No   Has patient ever received premedication for IV contrast dye?:  N/A  Does patient take metformin?: No  If patient does take metformin when was the last dose: N/A  Date of lab work: 05/02/18 BUN: 14 CR: 0.98 EGFR: >60  IV site: left anterior forearm  Has IV site been added to flowsheet?  Yes.   BP 124/64 (BP Location: Right Arm)   Pulse (!) 113   Temp 97.6 F (36.4 C) (Oral)   Wt 256 lb 12.8 oz (116.5 kg)   SpO2 100% Comment: room air  BMI 32.10 kg/m

## 2018-05-02 ENCOUNTER — Ambulatory Visit
Admission: RE | Admit: 2018-05-02 | Discharge: 2018-05-02 | Disposition: A | Discharge status: Home / Self Care | Payer: BLUE CROSS/BLUE SHIELD | Source: Ambulatory Visit | Attending: Radiation Oncology | Admitting: Radiation Oncology

## 2018-05-02 ENCOUNTER — Other Ambulatory Visit: Payer: Self-pay

## 2018-05-02 ENCOUNTER — Encounter: Payer: Self-pay | Admitting: Radiation Oncology

## 2018-05-02 VITALS — BP 124/64 | HR 113 | Temp 97.6°F | Wt 256.8 lb

## 2018-05-02 DIAGNOSIS — Z510 Encounter for antineoplastic radiation therapy: Secondary | ICD-10-CM | POA: Insufficient documentation

## 2018-05-02 DIAGNOSIS — C7931 Secondary malignant neoplasm of brain: Secondary | ICD-10-CM | POA: Diagnosis present

## 2018-05-02 DIAGNOSIS — C3490 Malignant neoplasm of unspecified part of unspecified bronchus or lung: Secondary | ICD-10-CM

## 2018-05-02 DIAGNOSIS — C7A8 Other malignant neuroendocrine tumors: Secondary | ICD-10-CM | POA: Diagnosis not present

## 2018-05-02 LAB — BUN & CREATININE (CHCC)
BUN: 14 mg/dL (ref 8–23)
Creatinine: 0.98 mg/dL (ref 0.61–1.24)
GFR, Est AFR Am: >60 mL/min
GFR, Estimated: >60 mL/min

## 2018-05-02 MED ORDER — SODIUM CHLORIDE 0.9% FLUSH
10.0000 mL | Freq: Once | INTRAVENOUS | Status: AC
  Administered 2018-05-02: 10 mL via INTRAVENOUS

## 2018-05-04 NOTE — Progress Notes (Signed)
  Name: Dwayne Massey MRN: [id number]  Date: 05/02/2018  DOB: 1955-09-12  SIMULATION AND TREATMENT PLANNING NOTE    ICD-10-CM   1. Brain metastases [facility]) C79.31       Radiation Oncology         [phone] ________________________________  Name: Dwayne Massey MRN: [id number]  Date: 05/02/2018  DOB: 1955/06/11  SIMULATION AND TREATMENT PLANNING NOTE Outpatient    ICD-10-CM   1. Brain metastases ([place]) C79.31     DIAGNOSIS:  Brain metastases (See above)  NARRATIVE:  The patient was brought to the [place].  Identity was confirmed.  All relevant records and images related to the planned course of therapy were reviewed.  The patient freely provided informed written consent to me to proceed with treatment after reviewing the details related to the planned course of therapy. The consent form was witnessed and verified by the simulation staff. Intravenous access was established for contrast administration. Then, the patient was set-up in a stable reproducible supine position for radiation therapy.  A relocatable thermoplastic stereotactic head frame was fabricated for precise immobilization.  CT images were obtained.  Surface markings were placed.  The CT images were loaded into the planning software and fused with the patient's targeting MRI scan.  Then the target and avoidance structures were contoured.  Treatment planning then occurred.  The radiation prescription was entered and confirmed.  I have requested 3D planning  I have requested a DVH of the following structures: Brain stem, brain, left eye, right eye, lenses, optic chiasm, target volumes, uninvolved brain, and normal tissue.   I discussed his case with Dr Sherwood Gambler and reviewed my contours with him.  SPECIAL TREATMENT PROCEDURE:  The planned course of therapy using radiation constitutes a special treatment procedure. Special care is required in the management of this patient for the following reasons:  High dose per  fraction requiring special monitoring for increased toxicities of treatment including daily imaging.  The special nature of the planned course of radiotherapy will require increased physician supervision and oversight to ensure patient's safety with optimal treatment outcomes.  PLAN:  The patient will receive 20 Gy in 1 fraction to 4 new metastases in the brain with SRS.  ________________________________    Eppie Gibson, MD

## 2018-05-08 DIAGNOSIS — Z510 Encounter for antineoplastic radiation therapy: Secondary | ICD-10-CM | POA: Diagnosis not present

## 2018-05-09 ENCOUNTER — Telehealth: Payer: Self-pay | Admitting: Medical Oncology

## 2018-05-09 ENCOUNTER — Other Ambulatory Visit: Payer: Self-pay | Admitting: Radiation Oncology

## 2018-05-09 ENCOUNTER — Encounter: Payer: Self-pay | Admitting: Radiation Therapy

## 2018-05-09 ENCOUNTER — Ambulatory Visit
Admission: RE | Admit: 2018-05-09 | Discharge: 2018-05-09 | Disposition: A | Discharge status: Home / Self Care | Payer: BLUE CROSS/BLUE SHIELD | Source: Ambulatory Visit | Attending: Radiation Oncology | Admitting: Radiation Oncology

## 2018-05-09 ENCOUNTER — Other Ambulatory Visit: Payer: Self-pay

## 2018-05-09 DIAGNOSIS — C7931 Secondary malignant neoplasm of brain: Secondary | ICD-10-CM

## 2018-05-09 MED ORDER — LORAZEPAM 2 MG PO TABS: ORAL_TABLET | ORAL | 0 refills | Status: DC

## 2018-05-09 MED FILL — LORazepam 2 MG TABS: 2 | 1 days supply | Qty: 1 | Fill #0

## 2018-05-09 NOTE — Telephone Encounter (Signed)
Confirmed appt for lab,CT and f/u visit with pt.

## 2018-05-09 NOTE — Op Note (Signed)
Stereotactic Radiosurgery Operative Note  Name: Dwayne Massey MRN: [id number]  Date: 05/09/2018  DOB: 06/14/1955  Op Note  Pre Operative Diagnosis:  Metastatic neuroendocrine carcinoma  Post Operative Diagnois:  Metastatic neuroendocrine carcinoma  3D TREATMENT PLANNING AND DOSIMETRY:  The patient's radiation plan was reviewed and approved by myself (neurosurgery) and Dr. Eppie Gibson (radiation oncology) using a [employer] Web[employer] Meeting (due to hospital and cancer center policies regarding social distancing, due to the ongoing coronavirus pandemic) prior to treatment.  It showed 3-dimensional radiation distributions overlaid onto the planning CT/MRI image set.  The [facility] for the target structures as well as the organs at risk were reviewed. The documentation of the 3D plan and dosimetry are filed in the radiation oncology EMR.  NARRATIVE:  Treatment was administered over 2 sessions (05/09/18 and 05/11/18, 2 days apart), due to the patient having a severe claustrophobic reaction during the first session.  2 VMAT beams were administered on 05/09/18 and 4 VMAT beams were administered on 05/11/18.  Each day of treatment  Dwayne Massey was brought to the TrueBeam stereotactic radiation treatment machine and placed supine on the CT couch. The head frame was applied, and the patient was set up for stereotactic radiosurgery.  I was present via a continuous video conference, using [employer] (due to hospital and cancer center policies regarding social distancing, due to the ongoing coronavirus pandemic), for the set-up and delivery.  SIMULATION VERIFICATION:  In the couch zero-angle position, the patient underwent Exactrac imaging using the Brainlab system with orthogonal KV images.  These were carefully aligned and repeated to confirm treatment position for each of the isocenters.  The Exactrac snap film verification was repeated at each couch angle.  SPECIAL TREATMENT PROCEDURE: Varney Baas received  stereotactic radiosurgery, using a multi-target, single isocenter technique, to the following four targets: Three cerebral (2 - 9 mm in diameter) and one cerebellar (2 mm in diameter) targets were treated using 6 Rapid Arc VMAT Beams to a prescription dose of 20 Gy for each lesion.  ExacTrac registration was performed for each couch angle.  The 100% isodose line was prescribed.  STEREOTACTIC TREATMENT MANAGEMENT:  Each day of treatment, following delivery, the patient was transported to nursing in stable condition and monitored for possible acute effects. The patient tolerated treatment without significant acute effects, and was discharged to home in stable condition each day.    PLAN: Follow-up in one month.

## 2018-05-09 NOTE — Progress Notes (Signed)
Dwayne Massey was unable to complete his [place] treatment today due to claustrophobia. He took 1mg  of Ativan prior to treatment, but still had difficulty with the mask. We were only able to complete 2 of the 6 planned arcs.  We will attempt completing the remainder of his treatment on 5/1 @ 10:00. Dr. Isidore Moos sent an Rx for 2mg  of Ativan to be taken 30 min prior to treatment on 5/1 in the hopes this will make him more comfortable and the mask more manageable.   Dwayne Massey R.T.(R)(T) Special Procedures Navigator  Radiation Oncology

## 2018-05-11 ENCOUNTER — Inpatient Hospital Stay: Payer: BC Managed Care – PPO | Attending: Internal Medicine

## 2018-05-11 ENCOUNTER — Other Ambulatory Visit (HOSPITAL_COMMUNITY): Payer: BLUE CROSS/BLUE SHIELD

## 2018-05-11 ENCOUNTER — Encounter (HOSPITAL_COMMUNITY): Payer: Self-pay

## 2018-05-11 ENCOUNTER — Encounter: Payer: Self-pay | Admitting: Radiation Oncology

## 2018-05-11 ENCOUNTER — Ambulatory Visit (HOSPITAL_COMMUNITY): Admission: RE | Admit: 2018-05-11 | Payer: BLUE CROSS/BLUE SHIELD | Source: Ambulatory Visit

## 2018-05-11 ENCOUNTER — Other Ambulatory Visit: Payer: Self-pay

## 2018-05-11 ENCOUNTER — Ambulatory Visit (HOSPITAL_COMMUNITY): Admission: RE | Admit: 2018-05-11 | Payer: PRIVATE HEALTH INSURANCE | Source: Ambulatory Visit

## 2018-05-11 ENCOUNTER — Ambulatory Visit (HOSPITAL_COMMUNITY)
Admission: RE | Admit: 2018-05-11 | Discharge: 2018-05-11 | Disposition: A | Discharge status: Home / Self Care | Payer: PRIVATE HEALTH INSURANCE | Source: Ambulatory Visit | Attending: Internal Medicine | Admitting: Internal Medicine

## 2018-05-11 ENCOUNTER — Ambulatory Visit
Admission: RE | Admit: 2018-05-11 | Discharge: 2018-05-11 | Disposition: A | Discharge status: Home / Self Care | Payer: BC Managed Care – PPO | Source: Ambulatory Visit | Attending: Radiation Oncology | Admitting: Radiation Oncology

## 2018-05-11 VITALS — BP 121/94 | HR 89 | Temp 97.7°F | Resp 20

## 2018-05-11 DIAGNOSIS — Z510 Encounter for antineoplastic radiation therapy: Secondary | ICD-10-CM | POA: Insufficient documentation

## 2018-05-11 DIAGNOSIS — C3490 Malignant neoplasm of unspecified part of unspecified bronchus or lung: Secondary | ICD-10-CM

## 2018-05-11 DIAGNOSIS — C7A8 Other malignant neuroendocrine tumors: Secondary | ICD-10-CM | POA: Diagnosis not present

## 2018-05-11 DIAGNOSIS — Z79899 Other long term (current) drug therapy: Secondary | ICD-10-CM | POA: Diagnosis not present

## 2018-05-11 DIAGNOSIS — Z5111 Encounter for antineoplastic chemotherapy: Secondary | ICD-10-CM | POA: Diagnosis not present

## 2018-05-11 DIAGNOSIS — C7B8 Other secondary neuroendocrine tumors: Secondary | ICD-10-CM | POA: Diagnosis not present

## 2018-05-11 DIAGNOSIS — C7931 Secondary malignant neoplasm of brain: Secondary | ICD-10-CM | POA: Diagnosis present

## 2018-05-11 DIAGNOSIS — C7951 Secondary malignant neoplasm of bone: Secondary | ICD-10-CM | POA: Insufficient documentation

## 2018-05-11 DIAGNOSIS — Z9221 Personal history of antineoplastic chemotherapy: Secondary | ICD-10-CM | POA: Insufficient documentation

## 2018-05-11 DIAGNOSIS — C7800 Secondary malignant neoplasm of unspecified lung: Secondary | ICD-10-CM | POA: Diagnosis not present

## 2018-05-11 LAB — CBC WITH DIFFERENTIAL (CANCER CENTER ONLY)
Abs Immature Granulocytes: 0.02 10*3/uL (ref 0.00–0.07)
Basophils Absolute: 0 10*3/uL (ref 0.0–0.1)
Basophils Relative: 0 %
Eosinophils Absolute: 0.2 10*3/uL (ref 0.0–0.5)
Eosinophils Relative: 4 %
HCT: 46.6 % (ref 39.0–52.0)
Hemoglobin: 15.1 g/dL (ref 13.0–17.0)
Immature Granulocytes: 0 %
Lymphocytes Relative: 9 %
Lymphs Abs: 0.5 10*3/uL — ABNORMAL LOW (ref 0.7–4.0)
MCH: 29.4 pg (ref 26.0–34.0)
MCHC: 32.4 g/dL (ref 30.0–36.0)
MCV: 90.7 fL (ref 80.0–100.0)
Monocytes Absolute: 0.4 10*3/uL (ref 0.1–1.0)
Monocytes Relative: 8 %
Neutro Abs: 4 10*3/uL (ref 1.7–7.7)
Neutrophils Relative %: 79 %
Platelet Count: 252 10*3/uL (ref 150–400)
RBC: 5.14 MIL/uL (ref 4.22–5.81)
RDW: 14.3 % (ref 11.5–15.5)
WBC Count: 5.1 10*3/uL (ref 4.0–10.5)
nRBC: 0 % (ref 0.0–0.2)

## 2018-05-11 LAB — CMP (CANCER CENTER ONLY)
ALT: 12 U/L (ref 0–44)
AST: 20 U/L (ref 15–41)
Albumin: 3.7 g/dL (ref 3.5–5.0)
Alkaline Phosphatase: 124 U/L (ref 38–126)
Anion gap: 9 (ref 5–15)
BUN: 16 mg/dL (ref 8–23)
CO2: 29 mmol/L (ref 22–32)
Calcium: 9.3 mg/dL (ref 8.9–10.3)
Chloride: 100 mmol/L (ref 98–111)
Creatinine: 1.08 mg/dL (ref 0.61–1.24)
GFR, Est AFR Am: >60 mL/min (ref >60)
GFR, Estimated: >60 mL/min (ref >60)
Glucose, Bld: 96 mg/dL (ref 70–99)
Potassium: 3.9 mmol/L (ref 3.5–5.1)
Sodium: 138 mmol/L (ref 135–145)
Total Bilirubin: 0.5 mg/dL (ref 0.3–1.2)
Total Protein: 7.8 g/dL (ref 6.5–8.1)

## 2018-05-11 MED ORDER — IOHEXOL 300 MG/ML  SOLN
100.0000 mL | Freq: Once | INTRAMUSCULAR | Status: AC | PRN
  Administered 2018-05-11: 100 mL via INTRAVENOUS

## 2018-05-11 MED ORDER — SODIUM CHLORIDE (PF) 0.9 % IJ SOLN
INTRAMUSCULAR | Status: AC
  Filled 2018-05-11: qty 50

## 2018-05-11 NOTE — Progress Notes (Signed)
  Radiation Oncology         [phone] ________________________________  Stereotactic Treatment Procedure Note Outpatient  Name: Dwayne Massey MRN: [id number]  Date: 05/09/2018  DOB: 02-Oct-1955    ICD-10-CM   1. Brain metastases (Dwayne Massey) C79.31     3D TREATMENT PLANNING AND DOSIMETRY:  The patient's radiation plan was reviewed and approved by neurosurgery and radiation oncology prior to treatment.  It showed 3-dimensional radiation distributions overlaid onto the planning CT/MRI image set.  The [facility] for the target structures as well as the organs at risk were reviewed. The documentation of the 3D plan and dosimetry are filed in the radiation oncology EMR.  NARRATIVE:  Dwayne Massey was brought to the TrueBeam stereotactic radiation treatment machine and placed supine on the CT couch. The head frame was applied, and the patient was set up for stereotactic radiosurgery.  Neurosurgery was present for the set-up and delivery  SIMULATION VERIFICATION:  In the couch zero-angle position, the patient underwent Exactrac imaging using the Brainlab system with orthogonal KV images.  These were carefully aligned and repeated to confirm treatment position for each of the isocenters.  The Exactrac snap film verification was repeated at each couch angle.  SPECIAL TREATMENT PROCEDURE: Dwayne Massey received stereotactic radiosurgery to the following targets: L parietal 38mm, L temporal 38mm, L temporal 63mm, and R cerebellar 8mm lesions were all treated incompletely due to patient intolerance of the mask and refusal to continue.  Ultimately the total prescribed dose is for 20Gy to each lesion. This will be done using 6 Rapid Arc VMAT Beams.   ExacTrac Snap verification was performed for each couch angle for the beginning of his course, today.   This constitutes a special treatment procedure due to the ablative dose delivered and the technical nature of treatment.  This highly technical modality of treatment  ensures that the ablative dose is centered on the patient's tumor while sparing normal tissues from excessive dose and risk of detrimental effects.  STEREOTACTIC TREATMENT MANAGEMENT:  Following delivery, the patient talked to Dr Isidore Moos and Dr Sherwood Gambler.  Plan to resume and finish treatment in 2 days upon taking twice the dose of Ativan (will take 2mg  on 5/1 for treatment.) The patient tolerated treatment without significant acute effects, and was discharged to home in stable condition.    PLAN: Follow-up in 2 days as above for completion of SRS  ________________________________   Eppie Gibson, MD

## 2018-05-11 NOTE — Progress Notes (Signed)
Dwayne Massey rested with [place] for 30 minutes following his [place] treatment.  Patient denies headache, dizziness, nausea, diplopia or ringing in the ears. Denies fatigue. Patient without complaints. Understands to avoid strenuous activity for the next 24 hours and call [phone] with needs.  Patient walked out to main lobby to meet his wife without difficulty.  Gloriajean Dell. Quincy Simmonds RN, BSN   BP (!) 121/94   Pulse 89   Temp 97.7 F (36.5 C) (Oral)   Resp 20   SpO2 99%

## 2018-05-11 NOTE — Progress Notes (Signed)
  Radiation Oncology         [phone] ________________________________  Stereotactic Treatment Procedure Note Outpatient  Name: Dwayne Massey MRN: [id number]  Date: 05/11/2018  DOB: Jul 17, 1955    ICD-10-CM   1. Brain metastases ([place]) C79.31     3D TREATMENT PLANNING AND DOSIMETRY:  The patient's radiation plan was reviewed and approved by neurosurgery and radiation oncology prior to treatment.  It showed 3-dimensional radiation distributions overlaid onto the planning CT/MRI image set.  The [facility] for the target structures as well as the organs at risk were reviewed. The documentation of the 3D plan and dosimetry are filed in the radiation oncology EMR.  NARRATIVE:  Dwayne Massey was brought to the TrueBeam stereotactic radiation treatment machine and placed supine on the CT couch. The head frame was applied, and the patient was set up for stereotactic radiosurgery.  Neurosurgery was present for the set-up and delivery  SIMULATION VERIFICATION:  In the couch zero-angle position, the patient underwent Exactrac imaging using the Brainlab system with orthogonal KV images.  These were carefully aligned and repeated to confirm treatment position for each of the isocenters.  The Exactrac snap film verification was repeated at each couch angle.  SPECIAL TREATMENT PROCEDURE: Dwayne Massey received stereotactic radiosurgery to the following targets: L parietal 14mm, L temporal 62mm, L temporal 31mm, and R cerebellar 27mm lesions were treated to complete the SRS course that was halted 2 days ago. Ultimately, at completion today, we had delivered 20Gy to each lesion. This was done using 6 Rapid Arc VMAT Beams.   ExacTrac Snap verification was performed for each couch angle today.   This constitutes a special treatment procedure due to the ablative dose delivered and the technical nature of treatment.  This highly technical modality of treatment ensures that the ablative dose is centered on the patient's  tumor while sparing normal tissues from excessive dose and risk of detrimental effects.  STEREOTACTIC TREATMENT MANAGEMENT:  Following delivery, the patient was observed in nursing for potential side effects.  Vitals as below. Vitals:   05/11/18 1150  BP: (!) 121/94  Pulse: 89  Resp: 20  Temp: 97.7 F (36.5 C)  SpO2: 99%   The patient tolerated treatment without significant acute effects, and was discharged to home in stable condition.    PLAN: Follow-up in 1 month. ________________________________   Eppie Gibson, MD

## 2018-05-14 ENCOUNTER — Other Ambulatory Visit: Payer: Self-pay

## 2018-05-14 ENCOUNTER — Inpatient Hospital Stay (HOSPITAL_BASED_OUTPATIENT_CLINIC_OR_DEPARTMENT_OTHER): Payer: BC Managed Care – PPO | Admitting: Internal Medicine

## 2018-05-14 ENCOUNTER — Encounter: Payer: Self-pay | Admitting: Internal Medicine

## 2018-05-14 ENCOUNTER — Telehealth: Payer: Self-pay | Admitting: Medical Oncology

## 2018-05-14 VITALS — BP 116/77 | HR 95 | Temp 97.8°F | Resp 18 | Ht 75.0 in | Wt 254.3 lb

## 2018-05-14 DIAGNOSIS — C7B8 Other secondary neuroendocrine tumors: Secondary | ICD-10-CM

## 2018-05-14 DIAGNOSIS — Z7189 Other specified counseling: Secondary | ICD-10-CM

## 2018-05-14 DIAGNOSIS — C7A8 Other malignant neuroendocrine tumors: Secondary | ICD-10-CM | POA: Diagnosis not present

## 2018-05-14 DIAGNOSIS — C7800 Secondary malignant neoplasm of unspecified lung: Secondary | ICD-10-CM

## 2018-05-14 DIAGNOSIS — Z9221 Personal history of antineoplastic chemotherapy: Secondary | ICD-10-CM

## 2018-05-14 DIAGNOSIS — Z79899 Other long term (current) drug therapy: Secondary | ICD-10-CM

## 2018-05-14 DIAGNOSIS — C7931 Secondary malignant neoplasm of brain: Secondary | ICD-10-CM | POA: Diagnosis not present

## 2018-05-14 DIAGNOSIS — C7951 Secondary malignant neoplasm of bone: Secondary | ICD-10-CM

## 2018-05-14 DIAGNOSIS — Z5111 Encounter for antineoplastic chemotherapy: Secondary | ICD-10-CM

## 2018-05-14 DIAGNOSIS — C3490 Malignant neoplasm of unspecified part of unspecified bronchus or lung: Secondary | ICD-10-CM

## 2018-05-14 MED ORDER — PROCHLORPERAZINE MALEATE 10 MG PO TABS: 10.0000 mg | ORAL_TABLET | Freq: Four times a day (QID) | ORAL | 0 refills | Status: AC | PRN

## 2018-05-14 MED FILL — PROCHLORPERAZINE 10 MG TAB: 10 | 7 days supply | Qty: 30 | Fill #0

## 2018-05-14 NOTE — Progress Notes (Signed)
START OFF PATHWAY REGIMEN - Small Cell Lung   [id number]:Cisplatin + Irinotecan:   A cycle is every 21 days:     Irinotecan      Cisplatin   **Always confirm dose/schedule in your pharmacy ordering system**  Patient Characteristics: Relapsed or Progressive Disease, Third Line and Beyond Therapeutic Status: Relapsed or Progressive Disease Line of Therapy: Third Line and Beyond  Intent of Therapy: Non-Curative / Palliative Intent, Discussed with Patient

## 2018-05-14 NOTE — Telephone Encounter (Signed)
Missed Appt -pt not aware he had appt today at 1115- Message sent to r/s for 1315-pt aware.Marland Kitchen

## 2018-05-14 NOTE — Progress Notes (Signed)
[place] [phone]   [phone]  OFFICE PROGRESS NOTE  Patient, No Pcp Per No address on file  DIAGNOSIS: metastatic neuroendocrine carcinoma initially thought to be metastatic disease from the right tonsil but the patient also has multiple metastatic disease in the lung mediastinal lymph nodes as well as now bone, brain and adrenal glands.   PRIOR THERAPY: He was treated with several chemotherapy regimens including cisplatin and etoposide as well as immunotherapy with ipilimumab and nivolumab and most recently with Tecentriq and Avastin.  Unfortunately the patient continues to have evidence for disease progression.  CURRENT THERAPY: Systemic chemotherapy with cisplatin 30 mg/M2 and irinotecan.  65 mg/M2 on days 1 and 8 every 3 weeks.  First dose May 21, 2018.  INTERVAL HISTORY: Dwayne Massey 63 y.o. male returns to the clinic today for follow-up visit.  His wife was available by phone.  The patient is feeling fine today with no concerning complaints except for intermittent lower back pain.  He denied having any current chest pain, shortness of breath, cough or hemoptysis.  He denied having any fever or chills.  He has no nausea, vomiting, diarrhea or constipation.  He denied having any fever or chills.  He has no headache or visual changes.  He has been on observation for the last few months.  The patient had repeat CT scan of the chest, abdomen and pelvis performed recently and is here for evaluation and discussion of his scan results.   MEDICAL HISTORY: Past Medical History:  Diagnosis Date   Cancer of tonsil ([place])    Chemotherapy induced nausea and vomiting    Chemotherapy induced neutropenia ([facility])    Herpes zoster    Meningitis    Metastatic cancer to brain ([place])     ALLERGIES:  has No Known Allergies.  MEDICATIONS:  Current Outpatient Medications  Medication Sig Dispense Refill   LORazepam (ATIVAN) 2 MG tablet Take 1 tablets  PO about 20-30 minutes before wearing radiosurgery mask to help with anxiety. Do not drive while on this. 1 tablet 0   No current facility-administered medications for this visit.     SURGICAL HISTORY:  Past Surgical History:  Procedure Laterality Date   KNEE SURGERY     LUNG SURGERY Right    for neuroendocrine tumor in [place]   PEG PLACEMENT      REVIEW OF SYSTEMS:  Constitutional: negative Eyes: negative Ears, nose, mouth, throat, and face: negative Respiratory: negative Cardiovascular: negative Gastrointestinal: negative Genitourinary:negative Integument/breast: negative Hematologic/lymphatic: negative Musculoskeletal:positive for back pain Neurological: negative Behavioral/Psych: negative Endocrine: negative Allergic/Immunologic: negative   PHYSICAL EXAMINATION: General appearance: alert, cooperative and no distress Head: Normocephalic, without obvious abnormality, atraumatic Neck: no adenopathy, no JVD, supple, symmetrical, trachea midline and thyroid not enlarged, symmetric, no tenderness/mass/nodules Lymph nodes: Cervical, supraclavicular, and axillary nodes normal. Resp: clear to auscultation bilaterally Back: symmetric, no curvature. ROM normal. No CVA tenderness. Cardio: regular rate and rhythm, S1, S2 normal, no murmur, click, rub or gallop GI: soft, non-tender; bowel sounds normal; no masses,  no organomegaly Extremities: extremities normal, atraumatic, no cyanosis or edema Neurologic: Alert and oriented X 3, normal strength and tone. Normal symmetric reflexes. Normal coordination and gait  ECOG PERFORMANCE STATUS: 1 - Symptomatic but completely ambulatory  Blood pressure 116/77, pulse 95, temperature 97.8 F (36.6 C), temperature source Oral, resp. rate 18, height 6\' 3"  (1.905 m), weight 254 lb 4.8 oz (115.3 kg), SpO2 96 %.  LABORATORY DATA: Lab Results  Component Value Date   WBC 5.1 05/11/2018   HGB 15.1 05/11/2018   HCT 46.6 05/11/2018   MCV 90.7  05/11/2018   PLT 252 05/11/2018      Chemistry      Component Value Date/Time   NA 138 05/11/2018 0836   K 3.9 05/11/2018 0836   CL 100 05/11/2018 0836   CO2 29 05/11/2018 0836   BUN 16 05/11/2018 0836   CREATININE 1.08 05/11/2018 0836      Component Value Date/Time   CALCIUM 9.3 05/11/2018 0836   ALKPHOS 124 05/11/2018 0836   AST 20 05/11/2018 0836   ALT 12 05/11/2018 0836   BILITOT 0.5 05/11/2018 0836       RADIOGRAPHIC STUDIES: Ct Chest W Contrast  Result Date: 05/11/2018 CLINICAL DATA:  Follow-up metastatic neuroendocrine carcinoma EXAM: CT CHEST, ABDOMEN, AND PELVIS WITH CONTRAST TECHNIQUE: Multidetector CT imaging of the chest, abdomen and pelvis was performed following the standard protocol during bolus administration of intravenous contrast. CONTRAST:  181mL OMNIPAQUE IOHEXOL 300 MG/ML SOLN, additional oral enteric contrast COMPARISON:  01/26/2018 FINDINGS: CT CHEST FINDINGS Cardiovascular: Left coronary artery calcifications. Normal heart size. No pericardial effusion. Mediastinum/Nodes: No enlarged mediastinal, hilar, or axillary lymph nodes. Thyroid gland, trachea, and esophagus demonstrate no significant findings. Lungs/Pleura: Significant interval enlargement of metastatic nodules in the right upper lobe, the largest nodule measuring 3.9 cm, previously 1.6 cm (series 4, image 48). Evidence of prior right lower lobe wedge resection. No pleural effusion or pneumothorax. Musculoskeletal: No chest wall mass. CT ABDOMEN PELVIS FINDINGS Hepatobiliary: No focal liver abnormality is seen. No gallstones, gallbladder wall thickening, or biliary dilatation. Pancreas: Unremarkable. No pancreatic ductal dilatation or surrounding inflammatory changes. Spleen: Normal in size without focal abnormality. Adrenals/Urinary Tract: Significant interval enlargement of bilateral adrenal lesions, a left adrenal mass now measuring approximately 11.0 cm, previously 5.8 cm (series 2, image 67). There is a  new or enlarged right adrenal nodule measuring 2.4 cm. Kidneys are normal, without renal calculi, focal lesion, or hydronephrosis. Bladder is unremarkable. Stomach/Bowel: Stomach is within normal limits. Appendix appears normal. No evidence of bowel wall thickening, distention, or inflammatory changes. Vascular/Lymphatic: No significant vascular findings are present. Multiple new and enlarged retroperitoneal and iliac lymph nodes, largest retroperitoneal lymph node measuring 2.3 cm in short axis, previously 1.4 cm (series 2, image 79). Reproductive: Evaluation of the pelvis is substantially limited by motion artifact. Other: No abdominal wall hernia or abnormality. No abdominopelvic ascites. There is a probable peritoneal or omental nodule in the right upper quadrant adjacent to the hepatic flexure of the colon measuring 1.2 cm, previously approximately 7 mm (series 2, image 58). Musculoskeletal: Redemonstrated nonspecific sclerosis of the T6 vertebral body, unchanged from prior although suspicious for metastasis. IMPRESSION: 1. Significant interval worsening of metastatic disease, with enlargement of pulmonary nodules, adrenal masses and nodules, a probable peritoneal or omental nodule, and new enlarged retroperitoneal lymphadenopathy. 2. Redemonstrated nonspecific sclerosis of the T6 vertebral body, unchanged from prior although suspicious for metastasis. PET-CT or nuclear bone scan may be used to more sensitive to evaluate for metastatic disease. Electronically Signed   By: Eddie Candle M.D.   On: 05/11/2018 10:30   Mr Jeri Cos GL Contrast  Result Date: 04/27/2018 CLINICAL DATA:  Metastatic neuroendocrine cancer.  SRS treatment. EXAM: MRI HEAD WITHOUT AND WITH CONTRAST TECHNIQUE: Multiplanar, multiecho pulse sequences of the brain and surrounding structures were obtained without and with intravenous contrast. CONTRAST:  20mL MULTIHANCE GADOBENATE DIMEGLUMINE 529 MG/ML IV SOLN COMPARISON:  02/15/2018.   09/17/2017. 06/09/2017.  03/23/2017. FINDINGS: Brain: Previously treated lesion in the peripheral right cerebellum approximate axial image 28 is no longer visible. Previously treated lesion higher in the peripheral right cerebellum image 35 measures 3 mm, stable. Treated lesion in the lateral right temporal lobe image 63 measures 12 mm, without significant surrounding edema. Previously this measured maximally 8 mm. This could represent pseudoprogression or true progression. Pseudo progression is favored. Progressive lesion at the left temporal tip measures 9 mm in diameter and is associated with mild edema. Punctate focus of enhancement medial to that is stable from previous studies. Newly seen 6 mm metastasis in the left parietal lobe image 109 adjacent to the posterior body the lateral ventricle. No edema. Previously treated right frontal lesion adjacent to the anterior body of the right lateral ventricle image 121 is larger, measuring 18 mm, with indistinct margins and pronounced increase in regional edema. This is favored to represent pseudo progression/treatment effect. Previously treated lesion in the superficial left parietal lobe axial image 121 is growing again, 6 mm today as opposed to 3 mm previously. No edema. Chronic ventriculomegaly is stable.  No sign of acute infarction. Vascular: Major vessels at the base of the brain show flow. Skull and upper cervical spine: Negative Sinuses/Orbits: Clear/normal Other: None IMPRESSION: Eight total lesions identified as noted above and marked on the axial imaging with arrows. New lesion in the left parietal brain axial image 109 measuring 6 mm. New/enlarged lesion at the left temporal tip measuring 9 mm with mild edema. Increased solid enhancement/enlargement of a left parietal previously treated lesion now measuring 6 mm image 121. Increased size of previously treated lesions of the right lateral temporal lobe and right frontal white matter which could be true  progression or pseudo progression. Pronounced increased edema associated with the right frontal lesion, favoring treatment effect. Two stable lesions in the right cerebellum. Punctate stable lesion in the medial anterior left temporal lobe. Electronically Signed   By: Nelson Chimes M.D.   On: 04/27/2018 11:32   Ct Abdomen Pelvis W Contrast  Result Date: 05/11/2018 CLINICAL DATA:  Follow-up metastatic neuroendocrine carcinoma EXAM: CT CHEST, ABDOMEN, AND PELVIS WITH CONTRAST TECHNIQUE: Multidetector CT imaging of the chest, abdomen and pelvis was performed following the standard protocol during bolus administration of intravenous contrast. CONTRAST:  126mL OMNIPAQUE IOHEXOL 300 MG/ML SOLN, additional oral enteric contrast COMPARISON:  01/26/2018 FINDINGS: CT CHEST FINDINGS Cardiovascular: Left coronary artery calcifications. Normal heart size. No pericardial effusion. Mediastinum/Nodes: No enlarged mediastinal, hilar, or axillary lymph nodes. Thyroid gland, trachea, and esophagus demonstrate no significant findings. Lungs/Pleura: Significant interval enlargement of metastatic nodules in the right upper lobe, the largest nodule measuring 3.9 cm, previously 1.6 cm (series 4, image 48). Evidence of prior right lower lobe wedge resection. No pleural effusion or pneumothorax. Musculoskeletal: No chest wall mass. CT ABDOMEN PELVIS FINDINGS Hepatobiliary: No focal liver abnormality is seen. No gallstones, gallbladder wall thickening, or biliary dilatation. Pancreas: Unremarkable. No pancreatic ductal dilatation or surrounding inflammatory changes. Spleen: Normal in size without focal abnormality. Adrenals/Urinary Tract: Significant interval enlargement of bilateral adrenal lesions, a left adrenal mass now measuring approximately 11.0 cm, previously 5.8 cm (series 2, image 67). There is a new or enlarged right adrenal nodule measuring 2.4 cm. Kidneys are normal, without renal calculi, focal lesion, or hydronephrosis.  Bladder is unremarkable. Stomach/Bowel: Stomach is within normal limits. Appendix appears normal. No evidence of bowel wall thickening, distention, or inflammatory changes. Vascular/Lymphatic: No significant vascular findings are  present. Multiple new and enlarged retroperitoneal and iliac lymph nodes, largest retroperitoneal lymph node measuring 2.3 cm in short axis, previously 1.4 cm (series 2, image 79). Reproductive: Evaluation of the pelvis is substantially limited by motion artifact. Other: No abdominal wall hernia or abnormality. No abdominopelvic ascites. There is a probable peritoneal or omental nodule in the right upper quadrant adjacent to the hepatic flexure of the colon measuring 1.2 cm, previously approximately 7 mm (series 2, image 58). Musculoskeletal: Redemonstrated nonspecific sclerosis of the T6 vertebral body, unchanged from prior although suspicious for metastasis. IMPRESSION: 1. Significant interval worsening of metastatic disease, with enlargement of pulmonary nodules, adrenal masses and nodules, a probable peritoneal or omental nodule, and new enlarged retroperitoneal lymphadenopathy. 2. Redemonstrated nonspecific sclerosis of the T6 vertebral body, unchanged from prior although suspicious for metastasis. PET-CT or nuclear bone scan may be used to more sensitive to evaluate for metastatic disease. Electronically Signed   By: Eddie Candle M.D.   On: 05/11/2018 10:30    ASSESSMENT AND PLAN: This is a very pleasant 63 years old white male with metastatic neuroendocrine carcinoma with metastatic disease from the right tonsil with multiple metastasis in the lung, mediastinal lymph nodes as well as bone, brain and adrenal glands.  The patient was treated in the past with ipilimumab and nivolumab followed by a Avastin and Tecentriq.  The patient has been on observation for the last few months.  He is feeling fine with no concerning complaints except for intermittent lower back pain. He had  repeat CT scan of the chest, abdomen and pelvis performed recently.  I personally and independently reviewed the scan images and discussed the results with the patient and his wife. He has a scan showed evidence for disease progression with enlargement of the pulmonary nodules in addition to the adrenal metastasis and the new enlarging retroperitoneal lymphadenopathy. I discussed with the patient his treatment options including palliative care versus palliative systemic chemotherapy with cisplatin 30 mg/M2 and irinotecan 65 mg/on days 1 and 8 every 3 weeks. I discussed with the patient the adverse effect of this treatment and he would like to proceed with systemic chemotherapy. He is expected to start the first cycle of this treatment on May 21, 2018. The patient will come back for follow-up visit in 4 weeks with the start of cycle #2. For the metastatic brain lesions, the patient is followed by Dr. Isidore Moos. He was advised to call immediately if he has any concerning symptoms in the interval. For the metastatic brain lesion, I referred the patient to radiation oncology for evaluation.  For the oral hemorrhage,  The patient voices understanding of current disease status and treatment options and is in agreement with the current care plan.  All questions were answered. The patient knows to call the clinic with any problems, questions or concerns. We can certainly see the patient much sooner if necessary.  Disclaimer: This note was dictated with voice recognition software. Similar sounding words can inadvertently be transcribed and may not be corrected upon review.

## 2018-05-15 ENCOUNTER — Telehealth: Payer: Self-pay | Admitting: Internal Medicine

## 2018-05-15 NOTE — Telephone Encounter (Signed)
Scheduled appt per 5/4 los - unable to reach patient , left message with appt date and time  For chemo edu and first treatment

## 2018-05-17 ENCOUNTER — Inpatient Hospital Stay: Payer: BC Managed Care – PPO

## 2018-05-21 ENCOUNTER — Inpatient Hospital Stay: Payer: BC Managed Care – PPO

## 2018-05-21 ENCOUNTER — Encounter: Payer: Self-pay | Admitting: *Deleted

## 2018-05-21 ENCOUNTER — Other Ambulatory Visit: Payer: Self-pay

## 2018-05-21 VITALS — BP 114/81 | HR 94 | Temp 97.7°F | Resp 18

## 2018-05-21 DIAGNOSIS — C7A8 Other malignant neuroendocrine tumors: Secondary | ICD-10-CM | POA: Diagnosis not present

## 2018-05-21 LAB — CBC WITH DIFFERENTIAL (CANCER CENTER ONLY)
Abs Immature Granulocytes: 0 10*3/uL (ref 0.00–0.07)
Basophils Absolute: 0 10*3/uL (ref 0.0–0.1)
Basophils Relative: 1 %
Eosinophils Absolute: 0.2 10*3/uL (ref 0.0–0.5)
Eosinophils Relative: 5 %
HCT: 46.6 % (ref 39.0–52.0)
Hemoglobin: 15.1 g/dL (ref 13.0–17.0)
Immature Granulocytes: 0 %
Lymphocytes Relative: 10 %
Lymphs Abs: 0.4 10*3/uL — ABNORMAL LOW (ref 0.7–4.0)
MCH: 29.4 pg (ref 26.0–34.0)
MCHC: 32.4 g/dL (ref 30.0–36.0)
MCV: 90.8 fL (ref 80.0–100.0)
Monocytes Absolute: 0.4 10*3/uL (ref 0.1–1.0)
Monocytes Relative: 9 %
Neutro Abs: 3 10*3/uL (ref 1.7–7.7)
Neutrophils Relative %: 75 %
Platelet Count: 229 10*3/uL (ref 150–400)
RBC: 5.13 MIL/uL (ref 4.22–5.81)
RDW: 14.3 % (ref 11.5–15.5)
WBC Count: 4 10*3/uL (ref 4.0–10.5)
nRBC: 0 % (ref 0.0–0.2)

## 2018-05-21 LAB — CMP (CANCER CENTER ONLY)
ALT: 14 U/L (ref 0–44)
AST: 21 U/L (ref 15–41)
Albumin: 3.7 g/dL (ref 3.5–5.0)
Alkaline Phosphatase: 125 U/L (ref 38–126)
Anion gap: 10 (ref 5–15)
BUN: 15 mg/dL (ref 8–23)
CO2: 23 mmol/L (ref 22–32)
Calcium: 9.5 mg/dL (ref 8.9–10.3)
Chloride: 104 mmol/L (ref 98–111)
Creatinine: 0.98 mg/dL (ref 0.61–1.24)
GFR, Est AFR Am: >60 mL/min (ref >60)
GFR, Estimated: >60 mL/min (ref >60)
Glucose, Bld: 106 mg/dL — ABNORMAL HIGH (ref 70–99)
Potassium: 4.1 mmol/L (ref 3.5–5.1)
Sodium: 137 mmol/L (ref 135–145)
Total Bilirubin: 0.7 mg/dL (ref 0.3–1.2)
Total Protein: 7.8 g/dL (ref 6.5–8.1)

## 2018-05-21 LAB — MAGNESIUM: Magnesium: 1.7 mg/dL (ref 1.7–2.4)

## 2018-05-21 MED ORDER — SODIUM CHLORIDE 0.9 % IV SOLN
Freq: Once | INTRAVENOUS | Status: AC
  Administered 2018-05-21: 12:00:00 via INTRAVENOUS
  Filled 2018-05-21: qty 5

## 2018-05-21 MED ORDER — ATROPINE SULFATE 1 MG/ML IJ SOLN
INTRAMUSCULAR | Status: AC
  Filled 2018-05-21: qty 1

## 2018-05-21 MED ORDER — SODIUM CHLORIDE 0.9 % IV SOLN
30.0000 mg/m2 | Freq: Once | INTRAVENOUS | Status: AC
  Administered 2018-05-21: 15:00:00 74 mg via INTRAVENOUS
  Filled 2018-05-21: qty 74

## 2018-05-21 MED ORDER — PALONOSETRON HCL INJECTION 0.25 MG/5ML
INTRAVENOUS | Status: AC
  Filled 2018-05-21: qty 5

## 2018-05-21 MED ORDER — IRINOTECAN HCL CHEMO INJECTION 100 MG/5ML
65.0000 mg/m2 | Freq: Once | INTRAVENOUS | Status: AC
  Administered 2018-05-21: 13:00:00 160 mg via INTRAVENOUS
  Filled 2018-05-21: qty 8

## 2018-05-21 MED ORDER — ATROPINE SULFATE 1 MG/ML IJ SOLN: 0.5000 mg | Freq: Once | INTRAMUSCULAR | Status: DC | PRN

## 2018-05-21 MED ORDER — POTASSIUM CHLORIDE 2 MEQ/ML IV SOLN
Freq: Once | INTRAVENOUS | Status: AC
  Administered 2018-05-21: 10:00:00 via INTRAVENOUS
  Filled 2018-05-21: qty 10

## 2018-05-21 MED ORDER — SODIUM CHLORIDE 0.9 % IV SOLN
Freq: Once | INTRAVENOUS | Status: AC
  Administered 2018-05-21: 10:00:00 via INTRAVENOUS
  Filled 2018-05-21: qty 250

## 2018-05-21 MED ORDER — PALONOSETRON HCL INJECTION 0.25 MG/5ML
0.2500 mg | Freq: Once | INTRAVENOUS | Status: AC
  Administered 2018-05-21: 0.25 mg via INTRAVENOUS

## 2018-05-21 NOTE — Patient Instructions (Addendum)
[facility] Discharge Instructions for Patients Receiving Chemotherapy  Today you received the following chemotherapy agents Irinotecan and Cisplatin  To help prevent nausea and vomiting after your treatment, we encourage you to take your nausea medication as prescribed.   If you develop nausea and vomiting that is not controlled by your nausea medication, call the clinic.   BELOW ARE SYMPTOMS THAT SHOULD BE REPORTED IMMEDIATELY:  *FEVER GREATER THAN 100.5 F  *CHILLS WITH OR WITHOUT FEVER  NAUSEA AND VOMITING THAT IS NOT CONTROLLED WITH YOUR NAUSEA MEDICATION  *UNUSUAL SHORTNESS OF BREATH  *UNUSUAL BRUISING OR BLEEDING  TENDERNESS IN MOUTH AND THROAT WITH OR WITHOUT PRESENCE OF ULCERS  *URINARY PROBLEMS  *BOWEL PROBLEMS  UNUSUAL RASH Items with * indicate a potential emergency and should be followed up as soon as possible.  Feel free to call the clinic should you have any questions or concerns. The clinic phone number is [phone].  Please show the [place] at check-in to the Emergency Department and triage nurse.  Irinotecan injection What is this medicine? IRINOTECAN (ir in oh TEE kan ) is a chemotherapy drug. It is used to treat colon and rectal cancer. This medicine may be used for other purposes; ask your health care provider or pharmacist if you have questions. COMMON BRAND NAME(S): Camptosar What should I tell my health care provider before I take this medicine? They need to know if you have any of these conditions: -dehydration -diarrhea -infection (especially a virus infection such as chickenpox, cold sores, or herpes) -liver disease -low blood counts, like low white cell, platelet, or red cell counts -low levels of calcium, magnesium, or potassium in the blood -recent or ongoing radiation therapy -an unusual or allergic reaction to irinotecan, other medicines, foods, dyes, or preservatives -pregnant or trying to get  pregnant -breast-feeding How should I use this medicine? This drug is given as an infusion into a vein. It is administered in a hospital or clinic by a specially trained health care professional. Talk to your pediatrician regarding the use of this medicine in children. Special care may be needed. Overdosage: If you think you have taken too much of this medicine contact a poison control center or emergency room at once. NOTE: This medicine is only for you. Do not share this medicine with others. What if I miss a dose? It is important not to miss your dose. Call your doctor or health care professional if you are unable to keep an appointment. What may interact with this medicine? This medicine may interact with the following medications: -antiviral medicines for HIV or AIDS -certain antibiotics like rifampin or rifabutin -certain medicines for fungal infections like itraconazole, ketoconazole, posaconazole, and voriconazole -certain medicines for seizures like carbamazepine, phenobarbital, phenotoin -clarithromycin -gemfibrozil -nefazodone -St. John's Wort This list may not describe all possible interactions. Give your health care provider a list of all the medicines, herbs, non-prescription drugs, or dietary supplements you use. Also tell them if you smoke, drink alcohol, or use illegal drugs. Some items may interact with your medicine. What should I watch for while using this medicine? Your condition will be monitored carefully while you are receiving this medicine. You will need important blood work done while you are taking this medicine. This drug may make you feel generally unwell. This is not uncommon, as chemotherapy can affect healthy cells as well as cancer cells. Report any side effects. Continue your course of treatment even though you feel ill unless your doctor  tells you to stop. In some cases, you may be given additional medicines to help with side effects. Follow all directions  for their use. You may get drowsy or dizzy. Do not drive, use machinery, or do anything that needs mental alertness until you know how this medicine affects you. Do not stand or sit up quickly, especially if you are an older patient. This reduces the risk of dizzy or fainting spells. Call your doctor or health care professional for advice if you get a fever, chills or sore throat, or other symptoms of a cold or flu. Do not treat yourself. This drug decreases your body's ability to fight infections. Try to avoid being around people who are sick. This medicine may increase your risk to bruise or bleed. Call your doctor or health care professional if you notice any unusual bleeding. Be careful brushing and flossing your teeth or using a toothpick because you may get an infection or bleed more easily. If you have any dental work done, tell your dentist you are receiving this medicine. Avoid taking products that contain aspirin, acetaminophen, ibuprofen, naproxen, or ketoprofen unless instructed by your doctor. These medicines may hide a fever. Do not become pregnant while taking this medicine. Women should inform their doctor if they wish to become pregnant or think they might be pregnant. There is a potential for serious side effects to an unborn child. Talk to your health care professional or pharmacist for more information. Do not breast-feed an infant while taking this medicine. What side effects may I notice from receiving this medicine? Side effects that you should report to your doctor or health care professional as soon as possible: -allergic reactions like skin rash, itching or hives, swelling of the face, lips, or tongue -chest pain -diarrhea -flushing, runny nose, sweating during infusion -low blood counts - this medicine may decrease the number of white blood cells, red blood cells and platelets. You may be at increased risk for infections and bleeding. -nausea, vomiting -pain, swelling,  warmth in the leg -signs of decreased platelets or bleeding - bruising, pinpoint red spots on the skin, black, tarry stools, blood in the urine -signs of infection - fever or chills, cough, sore throat, pain or difficulty passing urine -signs of decreased red blood cells - unusually weak or tired, fainting spells, lightheadedness Side effects that usually do not require medical attention (report to your doctor or health care professional if they continue or are bothersome): -constipation -hair loss -headache -loss of appetite -mouth sores -stomach pain This list may not describe all possible side effects. Call your doctor for medical advice about side effects. You may report side effects to FDA at 1-800-FDA-1088. Where should I keep my medicine? This drug is given in a hospital or clinic and will not be stored at home. NOTE: This sheet is a summary. It may not cover all possible information. If you have questions about this medicine, talk to your doctor, pharmacist, or health care provider.  2019 Elsevier/Gold Standard (2017-03-14 15:02:58)   Cisplatin injection What is this medicine? CISPLATIN (SIS pla tin) is a chemotherapy drug. It targets fast dividing cells, like cancer cells, and causes these cells to die. This medicine is used to treat many types of cancer like bladder, ovarian, and testicular cancers. This medicine may be used for other purposes; ask your health care provider or pharmacist if you have questions. COMMON BRAND NAME(S): Platinol, Platinol -AQ What should I tell my health care provider before I take this  medicine? They need to know if you have any of these conditions: -blood disorders -hearing problems -kidney disease -recent or ongoing radiation therapy -an unusual or allergic reaction to cisplatin, carboplatin, other chemotherapy, other medicines, foods, dyes, or preservatives -pregnant or trying to get pregnant -breast-feeding How should I use this  medicine? This drug is given as an infusion into a vein. It is administered in a hospital or clinic by a specially trained health care professional. Talk to your pediatrician regarding the use of this medicine in children. Special care may be needed. Overdosage: If you think you have taken too much of this medicine contact a poison control center or emergency room at once. NOTE: This medicine is only for you. Do not share this medicine with others. What if I miss a dose? It is important not to miss a dose. Call your doctor or health care professional if you are unable to keep an appointment. What may interact with this medicine? -dofetilide -foscarnet -medicines for seizures -medicines to increase blood counts like filgrastim, pegfilgrastim, sargramostim -probenecid -pyridoxine used with altretamine -rituximab -some antibiotics like amikacin, gentamicin, neomycin, polymyxin B, streptomycin, tobramycin -sulfinpyrazone -vaccines -zalcitabine Talk to your doctor or health care professional before taking any of these medicines: -acetaminophen -aspirin -ibuprofen -ketoprofen -naproxen This list may not describe all possible interactions. Give your health care provider a list of all the medicines, herbs, non-prescription drugs, or dietary supplements you use. Also tell them if you smoke, drink alcohol, or use illegal drugs. Some items may interact with your medicine. What should I watch for while using this medicine? Your condition will be monitored carefully while you are receiving this medicine. You will need important blood work done while you are taking this medicine. This drug may make you feel generally unwell. This is not uncommon, as chemotherapy can affect healthy cells as well as cancer cells. Report any side effects. Continue your course of treatment even though you feel ill unless your doctor tells you to stop. In some cases, you may be given additional medicines to help with side  effects. Follow all directions for their use. Call your doctor or health care professional for advice if you get a fever, chills or sore throat, or other symptoms of a cold or flu. Do not treat yourself. This drug decreases your body's ability to fight infections. Try to avoid being around people who are sick. This medicine may increase your risk to bruise or bleed. Call your doctor or health care professional if you notice any unusual bleeding. Be careful brushing and flossing your teeth or using a toothpick because you may get an infection or bleed more easily. If you have any dental work done, tell your dentist you are receiving this medicine. Avoid taking products that contain aspirin, acetaminophen, ibuprofen, naproxen, or ketoprofen unless instructed by your doctor. These medicines may hide a fever. Do not become pregnant while taking this medicine. Women should inform their doctor if they wish to become pregnant or think they might be pregnant. There is a potential for serious side effects to an unborn child. Talk to your health care professional or pharmacist for more information. Do not breast-feed an infant while taking this medicine. Drink fluids as directed while you are taking this medicine. This will help protect your kidneys. Call your doctor or health care professional if you get diarrhea. Do not treat yourself. What side effects may I notice from receiving this medicine? Side effects that you should report to your doctor or  health care professional as soon as possible: -allergic reactions like skin rash, itching or hives, swelling of the face, lips, or tongue -signs of infection - fever or chills, cough, sore throat, pain or difficulty passing urine -signs of decreased platelets or bleeding - bruising, pinpoint red spots on the skin, black, tarry stools, nosebleeds -signs of decreased red blood cells - unusually weak or tired, fainting spells, lightheadedness -breathing  problems -changes in hearing -gout pain -low blood counts - This drug may decrease the number of white blood cells, red blood cells and platelets. You may be at increased risk for infections and bleeding. -nausea and vomiting -pain, swelling, redness or irritation at the injection site -pain, tingling, numbness in the hands or feet -problems with balance, movement -trouble passing urine or change in the amount of urine Side effects that usually do not require medical attention (report to your doctor or health care professional if they continue or are bothersome): -changes in vision -loss of appetite -metallic taste in the mouth or changes in taste This list may not describe all possible side effects. Call your doctor for medical advice about side effects. You may report side effects to FDA at 1-800-FDA-1088. Where should I keep my medicine? This drug is given in a hospital or clinic and will not be stored at home. NOTE: This sheet is a summary. It may not cover all possible information. If you have questions about this medicine, talk to your doctor, pharmacist, or health care provider.  2019 Elsevier/Gold Standard (2007-04-03 14:40:54)   Coronavirus (COVID-19) Are you at risk?  Are you at risk for the Coronavirus (COVID-19)?  To be considered HIGH RISK for Coronavirus (COVID-19), you have to meet the following criteria:  . Traveled to [place], [place], [place], [place] or [place]; or in the [place] to [place], [place], [place], or [place]; and have fever, cough, and shortness of breath within the last 2 weeks of travel OR . Been in close contact with a person diagnosed with COVID-19 within the last 2 weeks and have fever, cough, and shortness of breath . IF YOU DO NOT MEET THESE CRITERIA, YOU ARE CONSIDERED LOW RISK FOR COVID-19.  What to do if you are HIGH RISK for COVID-19?  Marland Kitchen If you are having a medical emergency, call 911. . Seek medical care right away. Before you  go to a doctor's office, urgent care or emergency department, call ahead and tell them about your recent travel, contact with someone diagnosed with COVID-19, and your symptoms. You should receive instructions from your physician's office regarding next steps of care.  . When you arrive at healthcare provider, tell the healthcare staff immediately you have returned from visiting [place], [place], [place], [place] or [place]; or traveled in the [place] to [place], [place], [place], or [place]; in the last two weeks or you have been in close contact with a person diagnosed with COVID-19 in the last 2 weeks.   . Tell the health care staff about your symptoms: fever, cough and shortness of breath. . After you have been seen by a medical provider, you will be either: o Tested for (COVID-19) and discharged home on quarantine except to seek medical care if symptoms worsen, and asked to  - Stay home and avoid contact with others until you get your results (4-5 days)  - Avoid travel on public transportation if possible (such as bus, train, or airplane) or o Sent to the Emergency Department by  EMS for evaluation, COVID-19 testing, and possible admission depending on your condition and test results.  What to do if you are LOW RISK for COVID-19?  Reduce your risk of any infection by using the same precautions used for avoiding the common cold or flu:  Marland Kitchen Wash your hands often with soap and warm water for at least 20 seconds.  If soap and water are not readily available, use an alcohol-based hand sanitizer with at least 60% alcohol.  . If coughing or sneezing, cover your mouth and nose by coughing or sneezing into the elbow areas of your shirt or coat, into a tissue or into your sleeve (not your hands). . Avoid shaking hands with others and consider head nods or verbal greetings only. . Avoid touching your eyes, nose, or mouth with unwashed hands.  . Avoid close contact with people who are  sick. . Avoid places or events with large numbers of people in one location, like concerts or sporting events. . Carefully consider travel plans you have or are making. . If you are planning any travel outside or inside the [place], visit the CDC's Travelers' Health webpage for the latest health notices. . If you have some symptoms but not all symptoms, continue to monitor at home and seek medical attention if your symptoms worsen. . If you are having a medical emergency, call 911.   [place] / e-Visit: [email or web address]         [facility]: [facility]: [phone]                   [facility]: [phone]

## 2018-05-22 ENCOUNTER — Telehealth: Payer: Self-pay | Admitting: *Deleted

## 2018-05-22 NOTE — Telephone Encounter (Signed)
-----   Message from Thomasene Lot, RN sent at 05/21/2018  3:11 PM EDT ----- Regarding: Dr Julien Nordmann 1st tx f/u call 1st tx f/u call

## 2018-05-22 NOTE — Telephone Encounter (Signed)
Attempted call to patient to f/u after his 1st chemotherapy treatment yesterday. No answer but was able to leave vm message for pt to call  [phone] with any questions or concerns or side effects of chemo he is not able to manage.

## 2018-05-29 ENCOUNTER — Inpatient Hospital Stay: Payer: BC Managed Care – PPO

## 2018-05-29 ENCOUNTER — Other Ambulatory Visit: Payer: Self-pay

## 2018-05-29 VITALS — BP 118/84 | HR 97 | Temp 98.2°F | Resp 18

## 2018-05-29 DIAGNOSIS — C7A8 Other malignant neuroendocrine tumors: Secondary | ICD-10-CM

## 2018-05-29 LAB — CMP (CANCER CENTER ONLY)
ALT: 16 U/L (ref 0–44)
AST: 16 U/L (ref 15–41)
Albumin: 3.6 g/dL (ref 3.5–5.0)
Alkaline Phosphatase: 123 U/L (ref 38–126)
Anion gap: 10 (ref 5–15)
BUN: 20 mg/dL (ref 8–23)
CO2: 23 mmol/L (ref 22–32)
Calcium: 9.6 mg/dL (ref 8.9–10.3)
Chloride: 101 mmol/L (ref 98–111)
Creatinine: 1.09 mg/dL (ref 0.61–1.24)
GFR, Est AFR Am: >60 mL/min (ref >60)
GFR, Estimated: >60 mL/min (ref >60)
Glucose, Bld: 113 mg/dL — ABNORMAL HIGH (ref 70–99)
Potassium: 4.1 mmol/L (ref 3.5–5.1)
Sodium: 134 mmol/L — ABNORMAL LOW (ref 135–145)
Total Bilirubin: 0.8 mg/dL (ref 0.3–1.2)
Total Protein: 7.3 g/dL (ref 6.5–8.1)

## 2018-05-29 LAB — CBC WITH DIFFERENTIAL (CANCER CENTER ONLY)
Abs Immature Granulocytes: 0.01 10*3/uL (ref 0.00–0.07)
Basophils Absolute: 0 10*3/uL (ref 0.0–0.1)
Basophils Relative: 0 %
Eosinophils Absolute: 0.1 10*3/uL (ref 0.0–0.5)
Eosinophils Relative: 4 %
HCT: 46.3 % (ref 39.0–52.0)
Hemoglobin: 15.7 g/dL (ref 13.0–17.0)
Immature Granulocytes: 0 %
Lymphocytes Relative: 19 %
Lymphs Abs: 0.5 10*3/uL — ABNORMAL LOW (ref 0.7–4.0)
MCH: 30 pg (ref 26.0–34.0)
MCHC: 33.9 g/dL (ref 30.0–36.0)
MCV: 88.4 fL (ref 80.0–100.0)
Monocytes Absolute: 0.2 10*3/uL (ref 0.1–1.0)
Monocytes Relative: 8 %
Neutro Abs: 1.9 10*3/uL (ref 1.7–7.7)
Neutrophils Relative %: 69 %
Platelet Count: 178 10*3/uL (ref 150–400)
RBC: 5.24 MIL/uL (ref 4.22–5.81)
RDW: 13.5 % (ref 11.5–15.5)
WBC Count: 2.8 10*3/uL — ABNORMAL LOW (ref 4.0–10.5)
nRBC: 0 % (ref 0.0–0.2)

## 2018-05-29 LAB — MAGNESIUM: Magnesium: 2 mg/dL (ref 1.7–2.4)

## 2018-05-29 MED ORDER — SODIUM CHLORIDE 0.9 % IV SOLN
Freq: Once | INTRAVENOUS | Status: AC
  Administered 2018-05-29: 12:00:00 via INTRAVENOUS
  Filled 2018-05-29: qty 5

## 2018-05-29 MED ORDER — IRINOTECAN HCL CHEMO INJECTION 100 MG/5ML
65.0000 mg/m2 | Freq: Once | INTRAVENOUS | Status: AC
  Administered 2018-05-29: 13:00:00 160 mg via INTRAVENOUS
  Filled 2018-05-29: qty 8

## 2018-05-29 MED ORDER — PALONOSETRON HCL INJECTION 0.25 MG/5ML
0.2500 mg | Freq: Once | INTRAVENOUS | Status: AC
  Administered 2018-05-29: 12:00:00 0.25 mg via INTRAVENOUS

## 2018-05-29 MED ORDER — PALONOSETRON HCL INJECTION 0.25 MG/5ML
INTRAVENOUS | Status: AC
  Filled 2018-05-29: qty 5

## 2018-05-29 MED ORDER — SODIUM CHLORIDE 0.9 % IV SOLN
30.0000 mg/m2 | Freq: Once | INTRAVENOUS | Status: AC
  Administered 2018-05-29: 15:00:00 74 mg via INTRAVENOUS
  Filled 2018-05-29: qty 74

## 2018-05-29 MED ORDER — POTASSIUM CHLORIDE 2 MEQ/ML IV SOLN
Freq: Once | INTRAVENOUS | Status: AC
  Administered 2018-05-29: 09:00:00 via INTRAVENOUS
  Filled 2018-05-29: qty 10

## 2018-05-29 MED ORDER — SODIUM CHLORIDE 0.9 % IV SOLN
Freq: Once | INTRAVENOUS | Status: AC
  Administered 2018-05-29: 09:00:00 via INTRAVENOUS
  Filled 2018-05-29: qty 250

## 2018-05-29 NOTE — Progress Notes (Signed)
Per Dr Julien Nordmann ok to release orders with urine output of 150 ml today.

## 2018-05-29 NOTE — Patient Instructions (Signed)
[facility] Discharge Instructions for Patients Receiving Chemotherapy  Today you received the following chemotherapy agents Irinotecan and Cisplatin  To help prevent nausea and vomiting after your treatment, we encourage you to take your nausea medication as prescribed.   If you develop nausea and vomiting that is not controlled by your nausea medication, call the clinic.   BELOW ARE SYMPTOMS THAT SHOULD BE REPORTED IMMEDIATELY:  *FEVER GREATER THAN 100.5 F  *CHILLS WITH OR WITHOUT FEVER  NAUSEA AND VOMITING THAT IS NOT CONTROLLED WITH YOUR NAUSEA MEDICATION  *UNUSUAL SHORTNESS OF BREATH  *UNUSUAL BRUISING OR BLEEDING  TENDERNESS IN MOUTH AND THROAT WITH OR WITHOUT PRESENCE OF ULCERS  *URINARY PROBLEMS  *BOWEL PROBLEMS  UNUSUAL RASH Items with * indicate a potential emergency and should be followed up as soon as possible.  Feel free to call the clinic should you have any questions or concerns. The clinic phone number is [phone].  Please show the [facility] at check-in to the Emergency Department and triage nurse.    Coronavirus (COVID-19) Are you at risk?  Are you at risk for the Coronavirus (COVID-19)?  To be considered HIGH RISK for Coronavirus (COVID-19), you have to meet the following criteria:  . Traveled to [place], [place], [place], [place] or [place]; or in the [place] to [place], [place], [place], or [place]; and have fever, cough, and shortness of breath within the last 2 weeks of travel OR . Been in close contact with a person diagnosed with COVID-19 within the last 2 weeks and have fever, cough, and shortness of breath . IF YOU DO NOT MEET THESE CRITERIA, YOU ARE CONSIDERED LOW RISK FOR COVID-19.  What to do if you are HIGH RISK for COVID-19?  Marland Kitchen If you are having a medical emergency, call 911. . Seek medical care right away. Before you go to a doctor's office, urgent care or emergency department, call ahead and tell  them about your recent travel, contact with someone diagnosed with COVID-19, and your symptoms. You should receive instructions from your physician's office regarding next steps of care.  . When you arrive at healthcare provider, tell the healthcare staff immediately you have returned from visiting [place], [place], [place], [place] or [place]; or traveled in the [place] to [place], [place], [place], or [place]; in the last two weeks or you have been in close contact with a person diagnosed with COVID-19 in the last 2 weeks.   . Tell the health care staff about your symptoms: fever, cough and shortness of breath. . After you have been seen by a medical provider, you will be either: o Tested for (COVID-19) and discharged home on quarantine except to seek medical care if symptoms worsen, and asked to  - Stay home and avoid contact with others until you get your results (4-5 days)  - Avoid travel on public transportation if possible (such as bus, train, or airplane) or o Sent to the Emergency Department by EMS for evaluation, COVID-19 testing, and possible admission depending on your condition and test results.  What to do if you are LOW RISK for COVID-19?  Reduce your risk of any infection by using the same precautions used for avoiding the common cold or flu:  Marland Kitchen Wash your hands often with soap and warm water for at least 20 seconds.  If soap and water are not readily available, use an alcohol-based hand sanitizer with at least 60% alcohol.  Marland Kitchen  If coughing or sneezing, cover your mouth and nose by coughing or sneezing into the elbow areas of your shirt or coat, into a tissue or into your sleeve (not your hands). . Avoid shaking hands with others and consider head nods or verbal greetings only. . Avoid touching your eyes, nose, or mouth with unwashed hands.  . Avoid close contact with people who are sick. . Avoid places or events with large numbers of people in one location, like concerts or  sporting events. . Carefully consider travel plans you have or are making. . If you are planning any travel outside or inside the [place], visit the CDC's Travelers' Health webpage for the latest health notices. . If you have some symptoms but not all symptoms, continue to monitor at home and seek medical attention if your symptoms worsen. . If you are having a medical emergency, call 911.   [place] / e-Visit: [email or web address]         [facility]: [facility]: [phone]                   [facility]: [phone]

## 2018-05-31 NOTE — Progress Notes (Signed)
  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 The patient tolerated treatment without significant acute effects.  Plan: The patient will follow-up with radiation oncology in one month.  ________________________________________________  Eppie Gibson, MD  This document serves as a record of services personally performed by Eppie Gibson, MD. It was created on her behalf by Rae Lips, a trained medical scribe. The creation of this record is based on the scribe's personal observations and the provider's statements to them. This document has been checked and approved by the attending provider.

## 2018-06-05 ENCOUNTER — Other Ambulatory Visit: Payer: Self-pay

## 2018-06-05 ENCOUNTER — Telehealth: Payer: Self-pay | Admitting: Medical Oncology

## 2018-06-05 ENCOUNTER — Inpatient Hospital Stay: Payer: BC Managed Care – PPO

## 2018-06-05 ENCOUNTER — Encounter: Payer: Self-pay | Admitting: Internal Medicine

## 2018-06-05 DIAGNOSIS — C7B8 Other secondary neuroendocrine tumors: Secondary | ICD-10-CM

## 2018-06-05 DIAGNOSIS — C7A8 Other malignant neuroendocrine tumors: Secondary | ICD-10-CM | POA: Diagnosis not present

## 2018-06-05 LAB — CBC WITH DIFFERENTIAL (CANCER CENTER ONLY)
Abs Immature Granulocytes: 0 10*3/uL (ref 0.00–0.07)
Basophils Absolute: 0 10*3/uL (ref 0.0–0.1)
Basophils Relative: 0 %
Eosinophils Absolute: 0.1 10*3/uL (ref 0.0–0.5)
Eosinophils Relative: 12 %
HCT: 40.8 % (ref 39.0–52.0)
Hemoglobin: 14.1 g/dL (ref 13.0–17.0)
Immature Granulocytes: 0 %
Lymphocytes Relative: 38 %
Lymphs Abs: 0.2 10*3/uL — ABNORMAL LOW (ref 0.7–4.0)
MCH: 30.1 pg (ref 26.0–34.0)
MCHC: 34.6 g/dL (ref 30.0–36.0)
MCV: 87 fL (ref 80.0–100.0)
Monocytes Absolute: 0 10*3/uL — ABNORMAL LOW (ref 0.1–1.0)
Monocytes Relative: 7 %
Neutro Abs: 0.3 10*3/uL — CL (ref 1.7–7.7)
Neutrophils Relative %: 43 %
Platelet Count: 84 10*3/uL — ABNORMAL LOW (ref 150–400)
RBC: 4.69 MIL/uL (ref 4.22–5.81)
RDW: 13.3 % (ref 11.5–15.5)
WBC Count: 0.6 10*3/uL — CL (ref 4.0–10.5)
nRBC: 0 % (ref 0.0–0.2)

## 2018-06-05 LAB — CMP (CANCER CENTER ONLY)
ALT: 14 U/L (ref 0–44)
AST: 14 U/L — ABNORMAL LOW (ref 15–41)
Albumin: 3.3 g/dL — ABNORMAL LOW (ref 3.5–5.0)
Alkaline Phosphatase: 116 U/L (ref 38–126)
Anion gap: 9 (ref 5–15)
BUN: 16 mg/dL (ref 8–23)
CO2: 25 mmol/L (ref 22–32)
Calcium: 9.1 mg/dL (ref 8.9–10.3)
Chloride: 96 mmol/L — ABNORMAL LOW (ref 98–111)
Creatinine: 0.93 mg/dL (ref 0.61–1.24)
GFR, Est AFR Am: >60 mL/min (ref >60)
GFR, Estimated: >60 mL/min (ref >60)
Glucose, Bld: 117 mg/dL — ABNORMAL HIGH (ref 70–99)
Potassium: 4.2 mmol/L (ref 3.5–5.1)
Sodium: 130 mmol/L — ABNORMAL LOW (ref 135–145)
Total Bilirubin: 0.8 mg/dL (ref 0.3–1.2)
Total Protein: 7.3 g/dL (ref 6.5–8.1)

## 2018-06-05 LAB — MAGNESIUM: Magnesium: 1.7 mg/dL (ref 1.7–2.4)

## 2018-06-05 NOTE — Progress Notes (Signed)
Went to lobby to introduce myself as Arboriculturist and to offer available resources.  Discussed one-time $68 Engineer, drilling to assist with personal expenses while going through treatment. Patient states his household is over the income guidelines. Advised if anything changes, to reach out to me if interested. He verbalized understanding.  He has my card for any additional financial questions or concerns.

## 2018-06-05 NOTE — Telephone Encounter (Signed)
neutrapenia -  Called pt with cbc/diff results and told him to expect a call from scheduler to get Granix injection tomorrow and Thursday . Schedule message sent.

## 2018-06-06 ENCOUNTER — Ambulatory Visit: Payer: BLUE CROSS/BLUE SHIELD

## 2018-06-06 ENCOUNTER — Telehealth: Payer: Self-pay | Admitting: Internal Medicine

## 2018-06-06 NOTE — Telephone Encounter (Signed)
Scheduled appt per 5/26 sch message - unable to reach patient . Left message with appt date and time  - let RN know I was unable to reach pt

## 2018-06-07 ENCOUNTER — Inpatient Hospital Stay: Payer: BC Managed Care – PPO

## 2018-06-07 ENCOUNTER — Other Ambulatory Visit: Payer: Self-pay

## 2018-06-07 ENCOUNTER — Telehealth: Payer: Self-pay | Admitting: Medical Oncology

## 2018-06-07 ENCOUNTER — Other Ambulatory Visit: Payer: Self-pay | Admitting: Medical Oncology

## 2018-06-07 DIAGNOSIS — C7A8 Other malignant neuroendocrine tumors: Secondary | ICD-10-CM | POA: Diagnosis not present

## 2018-06-07 DIAGNOSIS — Z5111 Encounter for antineoplastic chemotherapy: Secondary | ICD-10-CM

## 2018-06-07 MED ORDER — TBO-FILGRASTIM 480 MCG/0.8ML ~~LOC~~ SOSY
PREFILLED_SYRINGE | SUBCUTANEOUS | Status: AC
  Filled 2018-06-07: qty 0.8

## 2018-06-07 MED ORDER — TBO-FILGRASTIM 480 MCG/0.8ML ~~LOC~~ SOSY
480.0000 ug | PREFILLED_SYRINGE | Freq: Once | SUBCUTANEOUS | Status: AC
  Administered 2018-06-07: 480 ug via SUBCUTANEOUS

## 2018-06-07 NOTE — Telephone Encounter (Signed)
Wife notified of appts today. She said he is not doing well , not eating and very weak. Pt neutrapenic.

## 2018-06-07 NOTE — Patient Instructions (Signed)
Tbo-Filgrastim injection What is this medicine? TBO-FILGRASTIM (T B O fil GRA stim) is a granulocyte colony-stimulating factor that stimulates the growth of neutrophils, a type of white blood cell important in the body's fight against infection. It is used to reduce the incidence of fever and infection in patients with certain types of cancer who are receiving chemotherapy that affects the bone marrow. This medicine may be used for other purposes; ask your health care provider or pharmacist if you have questions. COMMON BRAND NAME(S): Granix What should I tell my health care provider before I take this medicine? They need to know if you have any of these conditions: -bone scan or tests planned -kidney disease -sickle cell anemia -an unusual or allergic reaction to tbo-filgrastim, filgrastim, pegfilgrastim, other medicines, foods, dyes, or preservatives -pregnant or trying to get pregnant -breast-feeding How should I use this medicine? This medicine is for injection under the skin. If you get this medicine at home, you will be taught how to prepare and give this medicine. Refer to the Instructions for Use that come with your medication packaging. Use exactly as directed. Take your medicine at regular intervals. Do not take your medicine more often than directed. It is important that you put your used needles and syringes in a special sharps container. Do not put them in a trash can. If you do not have a sharps container, call your pharmacist or healthcare provider to get one. Talk to your pediatrician regarding the use of this medicine in children. While this drug may be prescribed for children as young as 1 month of age for selected conditions, precautions do apply. Overdosage: If you think you have taken too much of this medicine contact a poison control center or emergency room at once. NOTE: This medicine is only for you. Do not share this medicine with others. What if I miss a dose? It is  important not to miss your dose. Call your doctor or health care professional if you miss a dose. What may interact with this medicine? This medicine may interact with the following medications: -medicines that may cause a release of neutrophils, such as lithium This list may not describe all possible interactions. Give your health care provider a list of all the medicines, herbs, non-prescription drugs, or dietary supplements you use. Also tell them if you smoke, drink alcohol, or use illegal drugs. Some items may interact with your medicine. What should I watch for while using this medicine? You may need blood work done while you are taking this medicine. What side effects may I notice from receiving this medicine? Side effects that you should report to your doctor or health care professional as soon as possible: -allergic reactions like skin rash, itching or hives, swelling of the face, lips, or tongue -back pain -blood in the urine -dark urine -dizziness -fast heartbeat -feeling faint -shortness of breath or breathing problems -signs and symptoms of infection like fever or chills; cough; or sore throat -signs and symptoms of kidney injury like trouble passing urine or change in the amount of urine -stomach or side pain, or pain at the shoulder -sweating -swelling of the legs, ankles, or abdomen -tiredness Side effects that usually do not require medical attention (report to your doctor or health care professional if they continue or are bothersome): -bone pain -diarrhea -headache -muscle pain -vomiting This list may not describe all possible side effects. Call your doctor for medical advice about side effects. You may report side effects to FDA at   1-800-FDA-1088. Where should I keep my medicine? Keep out of the reach of children. Store in a refrigerator between 2 and 8 degrees C (36 and 46 degrees F). Keep in carton to protect from light. Throw away this medicine if it is left out  of the refrigerator for more than 5 consecutive days. Throw away any unused medicine after the expiration date. NOTE: This sheet is a summary. It may not cover all possible information. If you have questions about this medicine, talk to your doctor, pharmacist, or health care provider.  2019 Elsevier/Gold Standard (2016-08-16 16:56:18)  

## 2018-06-08 ENCOUNTER — Inpatient Hospital Stay: Payer: BC Managed Care – PPO

## 2018-06-08 ENCOUNTER — Other Ambulatory Visit: Payer: Self-pay

## 2018-06-08 VITALS — BP 107/65 | HR 97 | Temp 98.0°F | Resp 18

## 2018-06-08 DIAGNOSIS — C7A8 Other malignant neuroendocrine tumors: Secondary | ICD-10-CM | POA: Diagnosis not present

## 2018-06-08 DIAGNOSIS — Z5111 Encounter for antineoplastic chemotherapy: Secondary | ICD-10-CM

## 2018-06-08 MED ORDER — TBO-FILGRASTIM 480 MCG/0.8ML ~~LOC~~ SOSY
PREFILLED_SYRINGE | SUBCUTANEOUS | Status: AC
  Filled 2018-06-08: qty 0.8

## 2018-06-08 MED ORDER — TBO-FILGRASTIM 480 MCG/0.8ML ~~LOC~~ SOSY
480.0000 ug | PREFILLED_SYRINGE | Freq: Once | SUBCUTANEOUS | Status: AC
  Administered 2018-06-08: 16:00:00 480 ug via SUBCUTANEOUS

## 2018-06-08 NOTE — Patient Instructions (Signed)
Tbo-Filgrastim injection What is this medicine? TBO-FILGRASTIM (T B O fil GRA stim) is a granulocyte colony-stimulating factor that stimulates the growth of neutrophils, a type of white blood cell important in the body's fight against infection. It is used to reduce the incidence of fever and infection in patients with certain types of cancer who are receiving chemotherapy that affects the bone marrow. This medicine may be used for other purposes; ask your health care provider or pharmacist if you have questions. COMMON BRAND NAME(S): Granix What should I tell my health care provider before I take this medicine? They need to know if you have any of these conditions: -bone scan or tests planned -kidney disease -sickle cell anemia -an unusual or allergic reaction to tbo-filgrastim, filgrastim, pegfilgrastim, other medicines, foods, dyes, or preservatives -pregnant or trying to get pregnant -breast-feeding How should I use this medicine? This medicine is for injection under the skin. If you get this medicine at home, you will be taught how to prepare and give this medicine. Refer to the Instructions for Use that come with your medication packaging. Use exactly as directed. Take your medicine at regular intervals. Do not take your medicine more often than directed. It is important that you put your used needles and syringes in a special sharps container. Do not put them in a trash can. If you do not have a sharps container, call your pharmacist or healthcare provider to get one. Talk to your pediatrician regarding the use of this medicine in children. While this drug may be prescribed for children as young as 1 month of age for selected conditions, precautions do apply. Overdosage: If you think you have taken too much of this medicine contact a poison control center or emergency room at once. NOTE: This medicine is only for you. Do not share this medicine with others. What if I miss a dose? It is  important not to miss your dose. Call your doctor or health care professional if you miss a dose. What may interact with this medicine? This medicine may interact with the following medications: -medicines that may cause a release of neutrophils, such as lithium This list may not describe all possible interactions. Give your health care provider a list of all the medicines, herbs, non-prescription drugs, or dietary supplements you use. Also tell them if you smoke, drink alcohol, or use illegal drugs. Some items may interact with your medicine. What should I watch for while using this medicine? You may need blood work done while you are taking this medicine. What side effects may I notice from receiving this medicine? Side effects that you should report to your doctor or health care professional as soon as possible: -allergic reactions like skin rash, itching or hives, swelling of the face, lips, or tongue -back pain -blood in the urine -dark urine -dizziness -fast heartbeat -feeling faint -shortness of breath or breathing problems -signs and symptoms of infection like fever or chills; cough; or sore throat -signs and symptoms of kidney injury like trouble passing urine or change in the amount of urine -stomach or side pain, or pain at the shoulder -sweating -swelling of the legs, ankles, or abdomen -tiredness Side effects that usually do not require medical attention (report to your doctor or health care professional if they continue or are bothersome): -bone pain -diarrhea -headache -muscle pain -vomiting This list may not describe all possible side effects. Call your doctor for medical advice about side effects. You may report side effects to FDA at   1-800-FDA-1088. Where should I keep my medicine? Keep out of the reach of children. Store in a refrigerator between 2 and 8 degrees C (36 and 46 degrees F). Keep in carton to protect from light. Throw away this medicine if it is left out  of the refrigerator for more than 5 consecutive days. Throw away any unused medicine after the expiration date. NOTE: This sheet is a summary. It may not cover all possible information. If you have questions about this medicine, talk to your doctor, pharmacist, or health care provider.  2019 Elsevier/Gold Standard (2016-08-16 16:56:18)  

## 2018-06-11 ENCOUNTER — Inpatient Hospital Stay: Payer: BC Managed Care – PPO

## 2018-06-11 ENCOUNTER — Other Ambulatory Visit: Payer: Self-pay

## 2018-06-11 ENCOUNTER — Telehealth: Payer: Self-pay | Admitting: Nutrition

## 2018-06-11 ENCOUNTER — Inpatient Hospital Stay: Payer: BC Managed Care – PPO | Attending: Internal Medicine | Admitting: Physician Assistant

## 2018-06-11 VITALS — BP 102/85 | HR 95 | Temp 98.2°F | Resp 18 | Ht 75.0 in | Wt 233.5 lb

## 2018-06-11 DIAGNOSIS — C7A8 Other malignant neuroendocrine tumors: Secondary | ICD-10-CM | POA: Diagnosis not present

## 2018-06-11 DIAGNOSIS — D61818 Other pancytopenia: Secondary | ICD-10-CM | POA: Insufficient documentation

## 2018-06-11 DIAGNOSIS — Z5111 Encounter for antineoplastic chemotherapy: Secondary | ICD-10-CM | POA: Diagnosis present

## 2018-06-11 DIAGNOSIS — C7A1 Malignant poorly differentiated neuroendocrine tumors: Secondary | ICD-10-CM | POA: Diagnosis present

## 2018-06-11 DIAGNOSIS — C7B8 Other secondary neuroendocrine tumors: Secondary | ICD-10-CM | POA: Diagnosis present

## 2018-06-11 DIAGNOSIS — Z79899 Other long term (current) drug therapy: Secondary | ICD-10-CM | POA: Diagnosis not present

## 2018-06-11 DIAGNOSIS — R634 Abnormal weight loss: Secondary | ICD-10-CM

## 2018-06-11 DIAGNOSIS — R197 Diarrhea, unspecified: Secondary | ICD-10-CM

## 2018-06-11 LAB — CBC WITH DIFFERENTIAL (CANCER CENTER ONLY)
Abs Immature Granulocytes: 0 10*3/uL (ref 0.00–0.07)
Basophils Absolute: 0 10*3/uL (ref 0.0–0.1)
Basophils Relative: 0 %
Eosinophils Absolute: 0 10*3/uL (ref 0.0–0.5)
Eosinophils Relative: 1 %
HCT: 40 % (ref 39.0–52.0)
Hemoglobin: 13.7 g/dL (ref 13.0–17.0)
Lymphocytes Relative: 30 %
Lymphs Abs: 0.5 10*3/uL — ABNORMAL LOW (ref 0.7–4.0)
MCH: 30 pg (ref 26.0–34.0)
MCHC: 34.3 g/dL (ref 30.0–36.0)
MCV: 87.5 fL (ref 80.0–100.0)
Metamyelocytes Relative: 1 %
Monocytes Absolute: 0.2 10*3/uL (ref 0.1–1.0)
Monocytes Relative: 10 %
Myelocytes: 1 %
Neutro Abs: 1 10*3/uL — ABNORMAL LOW (ref 1.7–17.7)
Neutrophils Relative %: 57 %
Platelet Count: 94 10*3/uL — ABNORMAL LOW (ref 150–400)
RBC: 4.57 MIL/uL (ref 4.22–5.81)
RDW: 13.6 % (ref 11.5–15.5)
WBC Count: 1.7 10*3/uL — ABNORMAL LOW (ref 4.0–10.5)
nRBC: 0 % (ref 0.0–0.2)

## 2018-06-11 LAB — CMP (CANCER CENTER ONLY)
ALT: 18 U/L (ref 0–44)
AST: 14 U/L — ABNORMAL LOW (ref 15–41)
Albumin: 3.1 g/dL — ABNORMAL LOW (ref 3.5–5.0)
Alkaline Phosphatase: 122 U/L (ref 38–126)
Anion gap: 8 (ref 5–15)
BUN: 15 mg/dL (ref 8–23)
CO2: 26 mmol/L (ref 22–32)
Calcium: 8.6 mg/dL — ABNORMAL LOW (ref 8.9–10.3)
Chloride: 99 mmol/L (ref 98–111)
Creatinine: 0.98 mg/dL (ref 0.61–1.24)
GFR, Est AFR Am: >60 mL/min (ref >60)
GFR, Estimated: >60 mL/min (ref >60)
Glucose, Bld: 116 mg/dL — ABNORMAL HIGH (ref 70–99)
Potassium: 3.3 mmol/L — ABNORMAL LOW (ref 3.5–5.1)
Sodium: 133 mmol/L — ABNORMAL LOW (ref 135–145)
Total Bilirubin: 0.4 mg/dL (ref 0.3–1.2)
Total Protein: 6.6 g/dL (ref 6.5–8.1)

## 2018-06-11 LAB — MAGNESIUM: Magnesium: 1.6 mg/dL — ABNORMAL LOW (ref 1.7–2.4)

## 2018-06-11 MED ORDER — DIPHENOXYLATE-ATROPINE 2.5-0.025 MG PO TABS: 1.0000 | ORAL_TABLET | Freq: Four times a day (QID) | ORAL | 0 refills | Status: AC | PRN

## 2018-06-11 MED ORDER — SODIUM CHLORIDE 0.9 % IV SOLN
INTRAVENOUS | Status: DC
  Administered 2018-06-11: 14:00:00 via INTRAVENOUS
  Filled 2018-06-11 (×2): qty 250

## 2018-06-11 MED FILL — DIPHENOXYLATE-ATROPINE 2.5-: 2.5-0.025 | 7 days supply | Qty: 30 | Fill #0

## 2018-06-11 NOTE — Patient Instructions (Signed)
Dehydration, Adult  Dehydration is when there is not enough fluid or water in your body. This happens when you lose more fluids than you take in. Dehydration can range from mild to very bad. It should be treated right away to keep it from getting very bad. Symptoms of mild dehydration may include:  Thirst.  Dry lips.  Slightly dry mouth.  Dry, warm skin.  Dizziness. Symptoms of moderate dehydration may include:  Very dry mouth.  Muscle cramps.  Dark pee (urine). Pee may be the color of tea.  Your body making less pee.  Your eyes making fewer tears.  Heartbeat that is uneven or faster than normal (palpitations).  Headache.  Light-headedness, especially when you stand up from sitting.  Fainting (syncope). Symptoms of very bad dehydration may include:  Changes in skin, such as: ? Cold and clammy skin. ? Blotchy (mottled) or pale skin. ? Skin that does not quickly return to normal after being lightly pinched and let go (poor skin turgor).  Changes in body fluids, such as: ? Feeling very thirsty. ? Your eyes making fewer tears. ? Not sweating when body temperature is high, such as in hot weather. ? Your body making very little pee.  Changes in vital signs, such as: ? Weak pulse. ? Pulse that is more than 100 beats a minute when you are sitting still. ? Fast breathing. ? Low blood pressure.  Other changes, such as: ? Sunken eyes. ? Cold hands and feet. ? Confusion. ? Lack of energy (lethargy). ? Trouble waking up from sleep. ? Short-term weight loss. ? Unconsciousness. Follow these instructions at home:   If told by your doctor, drink an ORS: ? Make an ORS by using instructions on the package. ? Start by drinking small amounts, about  cup (120 mL) every 5-10 minutes. ? Slowly drink more until you have had the amount that your doctor said to have.  Drink enough clear fluid to keep your pee clear or pale yellow. If you were told to drink an ORS, finish the  ORS first, then start slowly drinking clear fluids. Drink fluids such as: ? Water. Do not drink only water by itself. Doing that can make the salt (sodium) level in your body get too low (hyponatremia). ? Ice chips. ? Fruit juice that you have added water to (diluted). ? Low-calorie sports drinks.  Avoid: ? Alcohol. ? Drinks that have a lot of sugar. These include high-calorie sports drinks, fruit juice that does not have water added, and soda. ? Caffeine. ? Foods that are greasy or have a lot of fat or sugar.  Take over-the-counter and prescription medicines only as told by your doctor.  Do not take salt tablets. Doing that can make the salt level in your body get too high (hypernatremia).  Eat foods that have minerals (electrolytes). Examples include bananas, oranges, potatoes, tomatoes, and spinach.  Keep all follow-up visits as told by your doctor. This is important. Contact a doctor if:  You have belly (abdominal) pain that: ? Gets worse. ? Stays in one area (localizes).  You have a rash.  You have a stiff neck.  You get angry or annoyed more easily than normal (irritability).  You are more sleepy than normal.  You have a harder time waking up than normal.  You feel: ? Weak. ? Dizzy. ? Very thirsty.  You have peed (urinated) only a small amount of very dark pee during 6-8 hours. Get help right away if:  You have  symptoms of very bad dehydration.  You cannot drink fluids without throwing up (vomiting).  Your symptoms get worse with treatment.  You have a fever.  You have a very bad headache.  You are throwing up or having watery poop (diarrhea) and it: ? Gets worse. ? Does not go away.  You have blood or something green (bile) in your throw-up.  You have blood in your poop (stool). This may cause poop to look black and tarry.  You have not peed in 6-8 hours.  You pass out (faint).  Your heart rate when you are sitting still is more than 100 beats a  minute.  You have trouble breathing. This information is not intended to replace advice given to you by your health care provider. Make sure you discuss any questions you have with your health care provider. Document Released: 10/23/2008 Document Revised: 07/17/2015 Document Reviewed: 02/20/2015 Elsevier Interactive Patient Education  2019 [place] (COVID-19) Are you at risk?  Are you at risk for the Coronavirus (COVID-19)?  To be considered HIGH RISK for Coronavirus (COVID-19), you have to meet the following criteria:   Traveled to [place], [place], [place], [place] or [place]; or in the [place] to [place], [place], [place], or [place]; and have fever, cough, and shortness of breath within the last 2 weeks of travel OR  Been in close contact with a person diagnosed with COVID-19 within the last 2 weeks and have fever, cough, and shortness of breath  IF YOU DO NOT MEET THESE CRITERIA, YOU ARE CONSIDERED LOW RISK FOR COVID-19.  What to do if you are HIGH RISK for COVID-19?   If you are having a medical emergency, call 911.  Seek medical care right away. Before you go to a doctors office, urgent care or emergency department, call ahead and tell them about your recent travel, contact with someone diagnosed with COVID-19, and your symptoms. You should receive instructions from your physicians office regarding next steps of care.   When you arrive at healthcare provider, tell the healthcare staff immediately you have returned from visiting [place], [place], [place], [place] or [place]; or traveled in the [place] to [place], [place], [place], or [place]; in the last two weeks or you have been in close contact with a person diagnosed with COVID-19 in the last 2 weeks.    Tell the health care staff about your symptoms: fever, cough and shortness of breath.  After you have been seen by a medical provider, you will be either: o Tested for (COVID-19)  and discharged home on quarantine except to seek medical care if symptoms worsen, and asked to  - Stay home and avoid contact with others until you get your results (4-5 days)  - Avoid travel on public transportation if possible (such as bus, train, or airplane) or o Sent to the Emergency Department by EMS for evaluation, COVID-19 testing, and possible admission depending on your condition and test results.  What to do if you are LOW RISK for COVID-19?  Reduce your risk of any infection by using the same precautions used for avoiding the common cold or flu:   Wash your hands often with soap and warm water for at least 20 seconds.  If soap and water are not readily available, use an alcohol-based hand sanitizer with at least 60% alcohol.   If coughing or sneezing, cover your mouth and nose by coughing or sneezing into the elbow areas of  your shirt or coat, into a tissue or into your sleeve (not your hands).  Avoid shaking hands with others and consider head nods or verbal greetings only.  Avoid touching your eyes, nose, or mouth with unwashed hands.   Avoid close contact with people who are sick.  Avoid places or events with large numbers of people in one location, like concerts or sporting events.  Carefully consider travel plans you have or are making.  If you are planning any travel outside or inside the [place], visit the [facility] webpage for the latest health notices.  If you have some symptoms but not all symptoms, continue to monitor at home and seek medical attention if your symptoms worsen.  If you are having a medical emergency, call 911.   [place] / e-Visit: [email or web address]         [facility]: [facility]: [phone]                   [facility]: [phone]

## 2018-06-11 NOTE — Telephone Encounter (Signed)
Nutrition appt has been scheduled for the pt to have a telephone visit on 6/3 at 145pm. Appt date and time has been given to the pt's wife. Aware that this is a telephone visit.

## 2018-06-11 NOTE — Progress Notes (Signed)
[facility] OFFICE PROGRESS NOTE  Patient, No Pcp Per No address on file  DIAGNOSIS: Metastatic neuroendocrine carcinoma initially thought to be metastatic disease from the right tonsil but the patient also has multiple metastatic disease in the lung mediastinal lymph nodes as well as now bone, brain and adrenal glands.   PRIOR THERAPY: He was treated with several chemotherapy regimens including cisplatin and etoposide as well as immunotherapy with ipilimumab and nivolumab and most recently with Tecentriq and Avastin. Unfortunately the patient continues to have evidence for disease progression.  CURRENT THERAPY: Systemic chemotherapy with cisplatin 30 mg/M2 and irinotecan.  65 mg/M2 on days 1 and 8 every 3 weeks.  First dose May 21, 2018. Status post 1 cycle of treatment.   INTERVAL HISTORY: Saivon Prowse 63 y.o. male returns to the clinic for a follow up visit.  The patient is feeling fatigued today.  He is status post his first cycle of treatment with cisplatin and irinotecan.  He tolerated treatment fair except he developed neutropenia which required granix injections. He also developed diarrhea immediately after treatment and it has not subsided over the last 3 weeks.  He states he has approximately 3 loose bowel movements a day.  He has not tried taking any over-the-counter medications for his diarrhea.  He denies any associated nausea, vomiting, fever, night sweats, chills, abdominal pain, or blood in the stool.  The patient states that he also has not an appetite over the last couple weeks and has consequently not been eating as much.  Per chart review, it appears that the patient has lost about 19 lbs since his appointment on 5/4.  He does not use any supplemental drinks such as boost or Ensure.   Otherwise he denies any chest pain, shortness of breath, cough, or hemoptysis.  He denies any headaches or changes with his vision.  He is followed closely by Eppie Gibson from  radiation oncology for his history of metastatic disease to the brain and has an appointment scheduled with her on Friday of this week for a 1 month follow up from his recent [facility].  He is here today for evaluation before starting the next cycle of treatment.   MEDICAL HISTORY: Past Medical History:  Diagnosis Date  . Cancer of tonsil ([place])   . Chemotherapy induced nausea and vomiting   . Chemotherapy induced neutropenia ([place])   . Herpes zoster   . Meningitis   . Metastatic cancer to brain [facility])     ALLERGIES:  has No Known Allergies.  MEDICATIONS:  Current Outpatient Medications  Medication Sig Dispense Refill  . diphenoxylate-atropine (LOMOTIL) 2.5-0.025 MG tablet Take 1 tablet by mouth 4 (four) times daily as needed for diarrhea or loose stools. 30 tablet 0  . LORazepam (ATIVAN) 2 MG tablet Take 1 tablets PO about 20-30 minutes before wearing radiosurgery mask to help with anxiety. Do not drive while on this. 1 tablet 0  . prochlorperazine (COMPAZINE) 10 MG tablet Take 1 tablet (10 mg total) by mouth every 6 (six) hours as needed for nausea or vomiting. 30 tablet 0   Current Facility-Administered Medications  Medication Dose Route Frequency Provider Last Rate Last Dose  . 0.9 %  sodium chloride infusion   Intravenous Continuous Heilingoetter, Cassandra L, PA-C 500 mL/hr at 06/11/18 1340      SURGICAL HISTORY:  Past Surgical History:  Procedure Laterality Date  . KNEE SURGERY    . LUNG SURGERY Right    for neuroendocrine tumor in [place]  .  PEG PLACEMENT      REVIEW OF SYSTEMS:   Review of Systems  Constitutional: Positive for appetite change, weight loss, and fatigue. Negative for chills and fever.  HENT:   Negative for mouth sores, nosebleeds, sore throat and trouble swallowing.   Eyes: Negative for eye problems and icterus.  Respiratory: Negative for cough, hemoptysis, shortness of breath and wheezing.   Cardiovascular: Negative for chest pain and leg swelling.   Gastrointestinal: Positive for diarrhea. Negative for abdominal pain, constipation,  nausea and vomiting.  Genitourinary: Negative for bladder incontinence, difficulty urinating, dysuria, frequency and hematuria.   Musculoskeletal: Negative for back pain, gait problem, neck pain and neck stiffness.  Skin: Negative for itching and rash.  Neurological: Negative for dizziness, extremity weakness, gait problem, headaches, light-headedness and seizures.  Hematological: Negative for adenopathy. Does not bruise/bleed easily.  Psychiatric/Behavioral: Negative for confusion, depression and sleep disturbance. The patient is not nervous/anxious.     PHYSICAL EXAMINATION:  Blood pressure 102/85, pulse 95, temperature 98.2 F (36.8 C), temperature source Oral, resp. rate 18, height 6\' 3"  (1.905 m), weight 233 lb 8 oz (105.9 kg), SpO2 100 %.  ECOG PERFORMANCE STATUS: 1 - Symptomatic but completely ambulatory  Physical Exam  Constitutional: Oriented to person, place, and time and well-developed, well-nourished, and in no distress.  HENT:  Head: Normocephalic and atraumatic.  Mouth/Throat: Oropharynx is clear and moist. No oropharyngeal exudate.  Eyes: Conjunctivae are normal. Right eye exhibits no discharge. Left eye exhibits no discharge. No scleral icterus.  Neck: Normal range of motion. Neck supple.  Cardiovascular: Normal rate, regular rhythm, normal heart sounds and intact distal pulses.   Pulmonary/Chest: Effort normal and breath sounds normal. No respiratory distress. No wheezes. No rales.  Abdominal: Soft. Bowel sounds are normal. Exhibits no distension and no mass. There is no tenderness.  Musculoskeletal: Normal range of motion. Exhibits no edema.  Lymphadenopathy:    No cervical adenopathy.  Neurological: Alert and oriented to person, place, and time. Exhibits normal muscle tone. Gait normal. Coordination normal.  Skin: Skin is warm and dry. No rash noted. Not diaphoretic. No erythema. No  pallor.  Psychiatric: Mood, memory and judgment normal.  Vitals reviewed.  LABORATORY DATA: Lab Results  Component Value Date   WBC 1.7 (L) 06/11/2018   HGB 13.7 06/11/2018   HCT 40.0 06/11/2018   MCV 87.5 06/11/2018   PLT 94 (L) 06/11/2018      Chemistry      Component Value Date/Time   NA 133 (L) 06/11/2018 1037   K 3.3 (L) 06/11/2018 1037   CL 99 06/11/2018 1037   CO2 26 06/11/2018 1037   BUN 15 06/11/2018 1037   CREATININE 0.98 06/11/2018 1037      Component Value Date/Time   CALCIUM 8.6 (L) 06/11/2018 1037   ALKPHOS 122 06/11/2018 1037   AST 14 (L) 06/11/2018 1037   ALT 18 06/11/2018 1037   BILITOT 0.4 06/11/2018 1037       RADIOGRAPHIC STUDIES:  No results found.   ASSESSMENT/PLAN:  This is a very pleasant 63 year old Caucasian male with metastatic neuroendocrine carcinoma initially thought to originate from the right tonsil.  The patient has metastatic disease to the lung, mediastinal lymph nodes, bone, brain, and adrenal glands.    He was treated with several chemotherapy regimens including cisplatin and etoposide as well as immunotherapy with ipilimumab and nivolumab and most recently with Tecentriq and Avastin.    The patient is currently undergoing treatment with cisplatin 30 mg/m and irinotecan  65 mg/m2 on days 1 and 8 every 3 weeks. He is status post his first cycle of treatment. He tolerated it fairly well except for diarrhea and fatigue.    The patient seen with Dr. Julien Nordmann today.  Labs were reviewed with the patient. His labs showed persistent neutropenia with an ANC of 1.0 and thrombocytopenia with a platelet count of 94. Dr. Julien Nordmann recommends that we delay treatment for about 1 week. We will arrange for the patient to receive 1 L of IVF today instead.   We will see him for a follow up visit in 1 week for evaluation and to review his lab work before starting cycle #2.   I will arrange for restaging CT scan before starting cycle #3.   He will  follow up with Dr. Isidore Moos from radiation oncology on Friday for a one month SRS follow up for his metastatic disease to the brain.   For the patient's diarrhea, he was instructed to use imodium. If his diarrhea is not controlled, he was given a prescription for lomotil. He was advised to refrain from using it unless needed.   Regarding the patient's significant 21 lb weight loss over the last 4 weeks, I have put in a referral to nutrition/diectician for their evaluation.   The patient was advised to call immediately if he has any concerning symptoms in the interval. The patient voices understanding of current disease status and treatment options and is in agreement with the current care plan. All questions were answered. The patient knows to call the clinic with any problems, questions or concerns. We can certainly see the patient much sooner if necessary  Orders Placed This Encounter  Procedures  . CT Chest W Contrast    Standing Status:   Future    Standing Expiration Date:   06/11/2019    Order Specific Question:   ** REASON FOR EXAM (FREE TEXT)    Answer:   Restaging Lung Cancer    Order Specific Question:   If indicated for the ordered procedure, I authorize the administration of contrast media per Radiology protocol    Answer:   Yes    Order Specific Question:   Preferred imaging location?    Answer:   [facility]    Order Specific Question:   Radiology Contrast Protocol - do NOT remove file path    Answer:   \\[email or web address]  . CT Abdomen Pelvis W Contrast    Standing Status:   Future    Standing Expiration Date:   06/11/2019    Order Specific Question:   ** REASON FOR EXAM (FREE TEXT)    Answer:   Restaging Lung Cancer    Order Specific Question:   If indicated for the ordered procedure, I authorize the administration of contrast media per Radiology protocol    Answer:   Yes    Order Specific Question:   Preferred imaging location?    Answer:    [facility]    Order Specific Question:   Is Oral Contrast requested for this exam?    Answer:   Yes, Per Radiology protocol    Order Specific Question:   Radiology Contrast Protocol - do NOT remove file path    Answer:   [email or web address]  . Ambulatory referral to Nutrition and Diabetic E    Referral Priority:   Routine    Referral Type:   Consultation    Referral Reason:   Specialty Services Required    Number of Visits  Requested:   [place], PA-C 06/11/18  ADDENDUM: Hematology/Oncology Attending: I had a face-to-face encounter with the patient.  I recommended his care plan.  This is a very pleasant 63 years old white male with metastatic high-grade neuroendocrine carcinoma.  The patient was recently started on systemic chemotherapy with reduced dose cisplatin and irinotecan status post 1 cycle.  Intermittently for the cycle well except for few episodes of nausea as well as diarrhea.  He was not taking any medication for his diarrhea. I recommended for the patient to use Imodium and we also gave him prescription for Lomotil to be used on as-needed basis for the diarrhea. We will delay the start of cycle #2 of his treatment until next week because of the low white blood count and absolute neutrophil count. I will also reduce his chemotherapy dose to cisplatin 25 mg/M2 and irinotecan 50 mg/2 starting from cycle #2 because of the adverse effect with the first cycle. The patient will come back for follow-up visit next week for reevaluation before starting cycle #2. He was advised to call immediately if he has any concerning symptoms in the interval.  Disclaimer: This note was dictated with voice recognition software. Similar sounding words can inadvertently be transcribed and may be missed upon review. Eilleen Kempf, MD 06/12/18

## 2018-06-12 ENCOUNTER — Telehealth: Payer: Self-pay | Admitting: Physician Assistant

## 2018-06-12 ENCOUNTER — Encounter: Payer: Self-pay | Admitting: Physician Assistant

## 2018-06-12 NOTE — Telephone Encounter (Signed)
Scheduled appt per 6/1 los - unable to reach pt . Left message with appt date and time

## 2018-06-13 ENCOUNTER — Other Ambulatory Visit: Payer: Self-pay | Admitting: Radiation Therapy

## 2018-06-13 ENCOUNTER — Inpatient Hospital Stay: Payer: BC Managed Care – PPO | Admitting: Nutrition

## 2018-06-13 ENCOUNTER — Telehealth: Payer: Self-pay | Admitting: Radiation Therapy

## 2018-06-13 DIAGNOSIS — C7931 Secondary malignant neoplasm of brain: Secondary | ICD-10-CM

## 2018-06-13 NOTE — Progress Notes (Signed)
RD working remotely.  Contacted patient 2 times on both cell and home number. I left message with name and phone number for return call.

## 2018-06-13 NOTE — Telephone Encounter (Signed)
Spoke with Dwayne Massey and his wife about his upcoming 1 mo follow-up with Dr. Isidore Moos. He is doing well since having the [place] brain treatment. No new worsening CNS symptoms. He reported that his headaches have improved and he feels better overall. Based on this, we will cancel the 1 mo follow-up visit  scheduled on 6/5, and plan to get a 3 months post Tx brain scan. (due early August 2020)   They were both very happy with this plan and will call if he has any issues or questions before his next scheduled visit.   Mont Dutton R.T.(R)(T) Special Procedures Navigator

## 2018-06-15 ENCOUNTER — Ambulatory Visit: Payer: Self-pay | Admitting: Radiation Oncology

## 2018-06-19 ENCOUNTER — Telehealth: Payer: Self-pay | Admitting: *Deleted

## 2018-06-19 ENCOUNTER — Other Ambulatory Visit: Payer: Self-pay

## 2018-06-19 ENCOUNTER — Other Ambulatory Visit: Payer: BLUE CROSS/BLUE SHIELD

## 2018-06-19 ENCOUNTER — Encounter: Payer: Self-pay | Admitting: Internal Medicine

## 2018-06-19 ENCOUNTER — Inpatient Hospital Stay (HOSPITAL_BASED_OUTPATIENT_CLINIC_OR_DEPARTMENT_OTHER): Payer: BC Managed Care – PPO | Admitting: Internal Medicine

## 2018-06-19 ENCOUNTER — Inpatient Hospital Stay: Payer: BC Managed Care – PPO

## 2018-06-19 VITALS — BP 101/72 | HR 87 | Temp 98.3°F | Resp 18 | Ht 75.0 in | Wt 240.3 lb

## 2018-06-19 DIAGNOSIS — C7A1 Malignant poorly differentiated neuroendocrine tumors: Secondary | ICD-10-CM | POA: Diagnosis not present

## 2018-06-19 DIAGNOSIS — C7A8 Other malignant neuroendocrine tumors: Secondary | ICD-10-CM | POA: Diagnosis not present

## 2018-06-19 DIAGNOSIS — C7B8 Other secondary neuroendocrine tumors: Secondary | ICD-10-CM | POA: Diagnosis not present

## 2018-06-19 DIAGNOSIS — D61818 Other pancytopenia: Secondary | ICD-10-CM | POA: Diagnosis not present

## 2018-06-19 DIAGNOSIS — Z79899 Other long term (current) drug therapy: Secondary | ICD-10-CM | POA: Diagnosis not present

## 2018-06-19 DIAGNOSIS — Z5111 Encounter for antineoplastic chemotherapy: Secondary | ICD-10-CM

## 2018-06-19 LAB — CMP (CANCER CENTER ONLY)
ALT: 11 U/L (ref 0–44)
AST: 16 U/L (ref 15–41)
Albumin: 3.3 g/dL — ABNORMAL LOW (ref 3.5–5.0)
Alkaline Phosphatase: 100 U/L (ref 38–126)
Anion gap: 9 (ref 5–15)
BUN: 9 mg/dL (ref 8–23)
CO2: 24 mmol/L (ref 22–32)
Calcium: 8.7 mg/dL — ABNORMAL LOW (ref 8.9–10.3)
Chloride: 103 mmol/L (ref 98–111)
Creatinine: 0.85 mg/dL (ref 0.61–1.24)
GFR, Est AFR Am: >60 mL/min (ref >60)
GFR, Estimated: >60 mL/min (ref >60)
Glucose, Bld: 100 mg/dL — ABNORMAL HIGH (ref 70–99)
Potassium: 4.1 mmol/L (ref 3.5–5.1)
Sodium: 136 mmol/L (ref 135–145)
Total Bilirubin: 0.3 mg/dL (ref 0.3–1.2)
Total Protein: 6.3 g/dL — ABNORMAL LOW (ref 6.5–8.1)

## 2018-06-19 LAB — CBC WITH DIFFERENTIAL (CANCER CENTER ONLY)
Abs Immature Granulocytes: 0.03 10*3/uL (ref 0.00–0.07)
Basophils Absolute: 0 10*3/uL (ref 0.0–0.1)
Basophils Relative: 1 %
Eosinophils Absolute: 0 10*3/uL (ref 0.0–0.5)
Eosinophils Relative: 0 %
HCT: 38.3 % — ABNORMAL LOW (ref 39.0–52.0)
Hemoglobin: 12.7 g/dL — ABNORMAL LOW (ref 13.0–17.0)
Immature Granulocytes: 1 %
Lymphocytes Relative: 15 %
Lymphs Abs: 0.4 10*3/uL — ABNORMAL LOW (ref 0.7–4.0)
MCH: 30.2 pg (ref 26.0–34.0)
MCHC: 33.2 g/dL (ref 30.0–36.0)
MCV: 91.2 fL (ref 80.0–100.0)
Monocytes Absolute: 0.3 10*3/uL (ref 0.1–1.0)
Monocytes Relative: 13 %
Neutro Abs: 1.7 10*3/uL (ref 1.7–7.7)
Neutrophils Relative %: 70 %
Platelet Count: 391 10*3/uL (ref 150–400)
RBC: 4.2 MIL/uL — ABNORMAL LOW (ref 4.22–5.81)
RDW: 16 % — ABNORMAL HIGH (ref 11.5–15.5)
WBC Count: 2.4 10*3/uL — ABNORMAL LOW (ref 4.0–10.5)
nRBC: 0 % (ref 0.0–0.2)

## 2018-06-19 LAB — MAGNESIUM: Magnesium: 1.4 mg/dL — CL (ref 1.7–2.4)

## 2018-06-19 MED ORDER — POTASSIUM CHLORIDE 2 MEQ/ML IV SOLN
Freq: Once | INTRAVENOUS | Status: AC
  Administered 2018-06-19: 11:00:00 via INTRAVENOUS
  Filled 2018-06-19: qty 10

## 2018-06-19 MED ORDER — SODIUM CHLORIDE 0.9 % IV SOLN
25.0000 mg/m2 | Freq: Once | INTRAVENOUS | Status: AC
  Administered 2018-06-19: 62 mg via INTRAVENOUS
  Filled 2018-06-19: qty 62

## 2018-06-19 MED ORDER — ATROPINE SULFATE 0.4 MG/ML IJ SOLN
0.4000 mg | Freq: Once | INTRAMUSCULAR | Status: AC | PRN
  Administered 2018-06-19: 15:00:00 0.4 mg via INTRAVENOUS

## 2018-06-19 MED ORDER — IRINOTECAN HCL CHEMO INJECTION 100 MG/5ML
50.0000 mg/m2 | Freq: Once | INTRAVENOUS | Status: AC
  Administered 2018-06-19: 120 mg via INTRAVENOUS
  Filled 2018-06-19: qty 6

## 2018-06-19 MED ORDER — PALONOSETRON HCL INJECTION 0.25 MG/5ML
INTRAVENOUS | Status: AC
  Filled 2018-06-19: qty 5

## 2018-06-19 MED ORDER — SODIUM CHLORIDE 0.9 % IV SOLN
Freq: Once | INTRAVENOUS | Status: AC
  Administered 2018-06-19: 11:00:00 via INTRAVENOUS
  Filled 2018-06-19: qty 250

## 2018-06-19 MED ORDER — ATROPINE SULFATE 1 MG/ML IJ SOLN: 0.5000 mg | Freq: Once | INTRAMUSCULAR | Status: DC | PRN

## 2018-06-19 MED ORDER — SODIUM CHLORIDE 0.9 % IV SOLN
Freq: Once | INTRAVENOUS | Status: AC
  Administered 2018-06-19: 14:00:00 via INTRAVENOUS
  Filled 2018-06-19: qty 5

## 2018-06-19 MED ORDER — ATROPINE SULFATE 0.4 MG/ML IJ SOLN
INTRAMUSCULAR | Status: AC
  Filled 2018-06-19: qty 1

## 2018-06-19 MED ORDER — PALONOSETRON HCL INJECTION 0.25 MG/5ML
0.2500 mg | Freq: Once | INTRAVENOUS | Status: AC
  Administered 2018-06-19: 0.25 mg via INTRAVENOUS

## 2018-06-19 NOTE — Telephone Encounter (Signed)
Received call report from [facility] [place].  "Today's Mg+ = 1.4."  Secure Chat sent with results.   Patient currently in treatment area for any further communication.

## 2018-06-19 NOTE — Progress Notes (Signed)
Dwayne Telephone:([phone]   Fax:([phone] Massey  OFFICE PROGRESS NOTE  Patient, No Pcp Per No address on file  DIAGNOSIS: metastatic neuroendocrine carcinoma initially thought to be metastatic disease from the right tonsil but the patient also has multiple metastatic disease in the lung mediastinal lymph nodes as well as now bone, brain and adrenal glands.   PRIOR THERAPY: He was treated with several chemotherapy regimens including cisplatin and etoposide as well as immunotherapy with ipilimumab and nivolumab and most recently with Tecentriq and Avastin.  Unfortunately the patient continues to have evidence for disease progression.  CURRENT THERAPY: Systemic chemotherapy with cisplatin 30 mg/M2 and irinotecan 65 mg/M2 on days 1 and 8 every 3 weeks.  First dose May 21, 2018.  Status post 1 cycle.  Starting from cycle #2 the dose of cisplatin will be reduced to 25 mg/M2 and immediately can 50 mg/m2 on days 1 and 8 every 3 weeks  INTERVAL HISTORY: Dwayne Massey 63 y.o. male returns to the clinic today for follow-up visit.  The patient is feeling fine today with no concerning complaints.  He tolerated the first cycle of his treatment well except for pancytopenia.  His cycle #2 was delayed by 1 week because of thrombocytopenia and neutropenia.  He is feeling much better today.  He gained around 6 pounds since his last visit.  He denied having any chest pain, shortness of breath, cough or hemoptysis.  He denied having any fever or chills.  He has no nausea, vomiting, diarrhea or constipation.  He is here today for evaluation before starting cycle #2.   MEDICAL HISTORY: Past Medical History:  Diagnosis Date  . Cancer of tonsil ([place])   . Chemotherapy induced nausea and vomiting   . Chemotherapy induced neutropenia ([place])   . Herpes zoster   . Meningitis   . Metastatic cancer to brain [facility])     ALLERGIES:  has No Known Allergies.  MEDICATIONS:  Current Outpatient  Medications  Medication Sig Dispense Refill  . diphenoxylate-atropine (LOMOTIL) 2.5-0.025 MG tablet Take 1 tablet by mouth 4 (four) times daily as needed for diarrhea or loose stools. 30 tablet 0  . LORazepam (ATIVAN) 2 MG tablet Take 1 tablets PO about 20-30 minutes before wearing radiosurgery mask to help with anxiety. Do not drive while on this. 1 tablet 0  . prochlorperazine (COMPAZINE) 10 MG tablet Take 1 tablet (10 mg total) by mouth every 6 (six) hours as needed for nausea or vomiting. 30 tablet 0   No current facility-administered medications for this visit.     SURGICAL HISTORY:  Past Surgical History:  Procedure Laterality Date  . KNEE SURGERY    . LUNG SURGERY Right    for neuroendocrine tumor in [place]  . PEG PLACEMENT      REVIEW OF SYSTEMS:  A comprehensive review of systems was negative except for: Constitutional: positive for fatigue   PHYSICAL EXAMINATION: General appearance: alert, cooperative and no distress Head: Normocephalic, without obvious abnormality, atraumatic Neck: no adenopathy, no JVD, supple, symmetrical, trachea midline and thyroid not enlarged, symmetric, no tenderness/mass/nodules Lymph nodes: Cervical, supraclavicular, and axillary nodes normal. Resp: clear to auscultation bilaterally Back: symmetric, no curvature. ROM normal. No CVA tenderness. Cardio: regular rate and rhythm, S1, S2 normal, no murmur, click, rub or gallop GI: soft, non-tender; bowel sounds normal; no masses,  no organomegaly Extremities: extremities normal, atraumatic, no cyanosis or edema  ECOG PERFORMANCE STATUS: 1 - Symptomatic but completely ambulatory  Blood pressure 101/72, pulse 87, temperature 98.3 F (36.8 C), temperature source Oral, resp. rate 18, height 6\' 3"  (1.905 m), weight 240 lb 4.8 oz (109 kg), SpO2 97 %.  LABORATORY DATA: Lab Results  Component Value Date   WBC 2.4 (L) 06/19/2018   HGB 12.7 (L) 06/19/2018   HCT 38.3 (L) 06/19/2018   MCV 91.2 06/19/2018    PLT 391 06/19/2018      Chemistry      Component Value Date/Time   NA 133 (L) 06/11/2018 1037   K 3.3 (L) 06/11/2018 1037   CL 99 06/11/2018 1037   CO2 26 06/11/2018 1037   BUN 15 06/11/2018 1037   CREATININE 0.98 06/11/2018 1037      Component Value Date/Time   CALCIUM 8.6 (L) 06/11/2018 1037   ALKPHOS 122 06/11/2018 1037   AST 14 (L) 06/11/2018 1037   ALT 18 06/11/2018 1037   BILITOT 0.4 06/11/2018 1037       RADIOGRAPHIC STUDIES: No results found.  ASSESSMENT AND PLAN: This is a very pleasant 63 years old white male with metastatic neuroendocrine carcinoma with metastatic disease from the right tonsil with multiple metastasis in the lung, mediastinal lymph nodes as well as bone, brain and adrenal glands.  The patient was treated in the past with ipilimumab and nivolumab followed by a Avastin and Tecentriq.  The patient has been on observation for the last few months.  He is feeling fine with no concerning complaints except for intermittent lower back pain. He had repeat CT scan of the chest, abdomen and pelvis performed recently.  I personally and independently reviewed the scan images and discussed the results with the patient and his wife. He has a scan showed evidence for disease progression with enlargement of the pulmonary nodules in addition to the adrenal metastasis and the new enlarging retroperitoneal lymphadenopathy. The patient started systemic chemotherapy with reduced dose cisplatin and irinotecan status post 1 cycle. He tolerated the first cycle of his treatment well except for pancytopenia. I recommended for him to proceed with cycle #2 today but with reduced dose of cisplatin 25 mg/M2 and irinotecan 50 mg/m2 on days 1 and 8 every 3 weeks. The patient will come back for follow-up visit in 3 weeks for evaluation with repeat CT scan of the chest, abdomen and pelvis for restaging of her disease. He was advised to call immediately if he has any concerning symptoms in  the interval. The patient voices understanding of current disease status and treatment options and is in agreement with the current care plan.  All questions were answered. The patient knows to call the clinic with any problems, questions or concerns. We can certainly see the patient much sooner if necessary.  Disclaimer: This note was dictated with voice recognition software. Similar sounding words can inadvertently be transcribed and may not be corrected upon review.

## 2018-06-19 NOTE — Progress Notes (Signed)
Per Dr. Earlie Server, to add additional 2g magnesium to fluids today (total 7mEq) with Mag lab resulting at 1.4.   Jalene Mullet, PharmD PGY2 Hematology/ Oncology Pharmacy Resident 06/19/2018 9:59 AM

## 2018-06-19 NOTE — Progress Notes (Signed)
Pt finished with 2 hr pre-hydration fluids, but only voided 41mL. Talked with Carolynn Serve and received OK to release pre-meds and Irinotecan and given pt more time to try and fulfill the 200 mL. Will continue to monitor

## 2018-06-19 NOTE — Progress Notes (Signed)
Per Dr. Julien Nordmann ok to continue treatment with Cisplatin without complete urine output and run post hydration concurrently with cisplatin.

## 2018-06-19 NOTE — Patient Instructions (Signed)
[facility] Discharge Instructions for Patients Receiving Chemotherapy  Today you received the following chemotherapy agents Irinotecan and Cisplatin  To help prevent nausea and vomiting after your treatment, we encourage you to take your nausea medication as prescribed.   If you develop nausea and vomiting that is not controlled by your nausea medication, call the clinic.   BELOW ARE SYMPTOMS THAT SHOULD BE REPORTED IMMEDIATELY:  *FEVER GREATER THAN 100.5 F  *CHILLS WITH OR WITHOUT FEVER  NAUSEA AND VOMITING THAT IS NOT CONTROLLED WITH YOUR NAUSEA MEDICATION  *UNUSUAL SHORTNESS OF BREATH  *UNUSUAL BRUISING OR BLEEDING  TENDERNESS IN MOUTH AND THROAT WITH OR WITHOUT PRESENCE OF ULCERS  *URINARY PROBLEMS  *BOWEL PROBLEMS  UNUSUAL RASH Items with * indicate a potential emergency and should be followed up as soon as possible.  Feel free to call the clinic should you have any questions or concerns. The clinic phone number is [phone].  Please show the [facility] at check-in to the Emergency Department and triage nurse.    Coronavirus (COVID-19) Are you at risk?  Are you at risk for the Coronavirus (COVID-19)?  To be considered HIGH RISK for Coronavirus (COVID-19), you have to meet the following criteria:  . Traveled to [place], [place], [place], [place] or [place]; or in the [place] to [place], [place], [place], or [place]; and have fever, cough, and shortness of breath within the last 2 weeks of travel OR . Been in close contact with a person diagnosed with COVID-19 within the last 2 weeks and have fever, cough, and shortness of breath . IF YOU DO NOT MEET THESE CRITERIA, YOU ARE CONSIDERED LOW RISK FOR COVID-19.  What to do if you are HIGH RISK for COVID-19?  Marland Kitchen If you are having a medical emergency, call 911. . Seek medical care right away. Before you go to a doctor's office, urgent care or emergency department, call ahead and tell  them about your recent travel, contact with someone diagnosed with COVID-19, and your symptoms. You should receive instructions from your physician's office regarding next steps of care.  . When you arrive at healthcare provider, tell the healthcare staff immediately you have returned from visiting [place], [place], [place], [place] or [place]; or traveled in the [place] to [place], [place], [place], or [place]; in the last two weeks or you have been in close contact with a person diagnosed with COVID-19 in the last 2 weeks.   . Tell the health care staff about your symptoms: fever, cough and shortness of breath. . After you have been seen by a medical provider, you will be either: o Tested for (COVID-19) and discharged home on quarantine except to seek medical care if symptoms worsen, and asked to  - Stay home and avoid contact with others until you get your results (4-5 days)  - Avoid travel on public transportation if possible (such as bus, train, or airplane) or o Sent to the Emergency Department by EMS for evaluation, COVID-19 testing, and possible admission depending on your condition and test results.  What to do if you are LOW RISK for COVID-19?  Reduce your risk of any infection by using the same precautions used for avoiding the common cold or flu:  Marland Kitchen Wash your hands often with soap and warm water for at least 20 seconds.  If soap and water are not readily available, use an alcohol-based hand sanitizer with at least 60% alcohol.  Marland Kitchen  If coughing or sneezing, cover your mouth and nose by coughing or sneezing into the elbow areas of your shirt or coat, into a tissue or into your sleeve (not your hands). . Avoid shaking hands with others and consider head nods or verbal greetings only. . Avoid touching your eyes, nose, or mouth with unwashed hands.  . Avoid close contact with people who are sick. . Avoid places or events with large numbers of people in one location, like concerts or  sporting events. . Carefully consider travel plans you have or are making. . If you are planning any travel outside or inside the [place], visit the CDC's Travelers' Health webpage for the latest health notices. . If you have some symptoms but not all symptoms, continue to monitor at home and seek medical attention if your symptoms worsen. . If you are having a medical emergency, call 911.   [place] / e-Visit: [email or web address]         [facility]: [facility]: [phone]                   [facility]: [phone]

## 2018-06-20 ENCOUNTER — Telehealth: Payer: Self-pay | Admitting: Internal Medicine

## 2018-06-20 ENCOUNTER — Ambulatory Visit: Payer: PRIVATE HEALTH INSURANCE

## 2018-06-20 NOTE — Progress Notes (Signed)
Nutrition  No call back from previous messages left by Ernestene Kiel on 6/3.  Called patient again today and no answer.  Left another message with RD callback information.    Nikaela Coyne B. Zenia Resides, [place], [place] Registered Dietitian [phone] (pager)

## 2018-06-20 NOTE — Telephone Encounter (Signed)
Scheduled appt per 6/09 los - added additional cycle - pt to get an updated schedule next visit.

## 2018-06-25 ENCOUNTER — Telehealth: Payer: Self-pay | Admitting: *Deleted

## 2018-06-25 ENCOUNTER — Other Ambulatory Visit: Payer: BLUE CROSS/BLUE SHIELD

## 2018-06-25 NOTE — Telephone Encounter (Signed)
Received call from pt's wife inquiring about schedule. Reviewed schedule with her. Pt has labs and chemo tomorrow. She voiced understanding. No further questions or concerns

## 2018-06-26 ENCOUNTER — Telehealth: Payer: Self-pay | Admitting: Physician Assistant

## 2018-06-26 ENCOUNTER — Inpatient Hospital Stay: Payer: BC Managed Care – PPO

## 2018-06-26 ENCOUNTER — Telehealth: Payer: Self-pay | Admitting: Medical Oncology

## 2018-06-26 NOTE — Telephone Encounter (Signed)
He can come for IV hydration.  We will wait to see what the scan next week shows.  Thank you

## 2018-06-26 NOTE — Telephone Encounter (Addendum)
Pt cancelled appt today Cisplatin ,irinotecan Cycle 2 day 8-  Per [name] he is "going down hill fast. He is weak , tired. He does walk by himself ,but holds on to wall".  Denies fever, nausea,vomiting, falls.

## 2018-06-26 NOTE — Telephone Encounter (Signed)
Schedule message sent for IVF and labs and [name] notified of appt.

## 2018-06-26 NOTE — Telephone Encounter (Signed)
Called patient regarding cancelling an appointment, patient's wife wants to cancel the appointments for 06/16.

## 2018-06-27 ENCOUNTER — Inpatient Hospital Stay: Payer: BC Managed Care – PPO

## 2018-06-27 ENCOUNTER — Other Ambulatory Visit: Payer: Self-pay | Admitting: *Deleted

## 2018-06-27 ENCOUNTER — Other Ambulatory Visit: Payer: Self-pay

## 2018-06-27 VITALS — BP 118/76 | HR 80 | Temp 98.6°F | Resp 16

## 2018-06-27 DIAGNOSIS — C3490 Malignant neoplasm of unspecified part of unspecified bronchus or lung: Secondary | ICD-10-CM

## 2018-06-27 DIAGNOSIS — C7A8 Other malignant neuroendocrine tumors: Secondary | ICD-10-CM

## 2018-06-27 LAB — CMP (CANCER CENTER ONLY)
ALT: 13 U/L (ref 0–44)
AST: 16 U/L (ref 15–41)
Albumin: 3.6 g/dL (ref 3.5–5.0)
Alkaline Phosphatase: 104 U/L (ref 38–126)
Anion gap: 8 (ref 5–15)
BUN: 13 mg/dL (ref 8–23)
CO2: 24 mmol/L (ref 22–32)
Calcium: 9.2 mg/dL (ref 8.9–10.3)
Chloride: 100 mmol/L (ref 98–111)
Creatinine: 0.94 mg/dL (ref 0.61–1.24)
GFR, Est AFR Am: >60 mL/min (ref >60)
GFR, Estimated: >60 mL/min (ref >60)
Glucose, Bld: 140 mg/dL — ABNORMAL HIGH (ref 70–99)
Potassium: 3.8 mmol/L (ref 3.5–5.1)
Sodium: 132 mmol/L — ABNORMAL LOW (ref 135–145)
Total Bilirubin: 0.5 mg/dL (ref 0.3–1.2)
Total Protein: 6.8 g/dL (ref 6.5–8.1)

## 2018-06-27 LAB — CBC WITH DIFFERENTIAL (CANCER CENTER ONLY)
Abs Immature Granulocytes: 0.02 10*3/uL (ref 0.00–0.07)
Basophils Absolute: 0 10*3/uL (ref 0.0–0.1)
Basophils Relative: 1 %
Eosinophils Absolute: 0 10*3/uL (ref 0.0–0.5)
Eosinophils Relative: 1 %
HCT: 40.5 % (ref 39.0–52.0)
Hemoglobin: 13.6 g/dL (ref 13.0–17.0)
Immature Granulocytes: 1 %
Lymphocytes Relative: 15 %
Lymphs Abs: 0.3 10*3/uL — ABNORMAL LOW (ref 0.7–4.0)
MCH: 30.1 pg (ref 26.0–34.0)
MCHC: 33.6 g/dL (ref 30.0–36.0)
MCV: 89.6 fL (ref 80.0–100.0)
Monocytes Absolute: 0.3 10*3/uL (ref 0.1–1.0)
Monocytes Relative: 12 %
Neutro Abs: 1.6 10*3/uL — ABNORMAL LOW (ref 1.7–7.7)
Neutrophils Relative %: 70 %
Platelet Count: 313 10*3/uL (ref 150–400)
RBC: 4.52 MIL/uL (ref 4.22–5.81)
RDW: 16 % — ABNORMAL HIGH (ref 11.5–15.5)
WBC Count: 2.2 10*3/uL — ABNORMAL LOW (ref 4.0–10.5)
nRBC: 0 % (ref 0.0–0.2)

## 2018-06-27 LAB — MAGNESIUM: Magnesium: 1.6 mg/dL — ABNORMAL LOW (ref 1.7–2.4)

## 2018-06-27 MED ORDER — SODIUM CHLORIDE 0.9 % IV SOLN
Freq: Once | INTRAVENOUS | Status: AC
  Administered 2018-06-27: 16:00:00 via INTRAVENOUS
  Filled 2018-06-27: qty 250

## 2018-06-27 MED ORDER — SODIUM CHLORIDE 0.9 % IV SOLN
Freq: Once | INTRAVENOUS | Status: DC
  Filled 2018-06-27: qty 250

## 2018-06-27 NOTE — Patient Instructions (Signed)

## 2018-07-02 ENCOUNTER — Other Ambulatory Visit: Payer: Self-pay

## 2018-07-02 ENCOUNTER — Emergency Department (HOSPITAL_COMMUNITY): Payer: No Typology Code available for payment source

## 2018-07-02 ENCOUNTER — Encounter (HOSPITAL_COMMUNITY): Payer: Self-pay

## 2018-07-02 ENCOUNTER — Inpatient Hospital Stay (HOSPITAL_COMMUNITY)
Admission: EM | Admit: 2018-07-02 | Discharge: 2018-07-08 | DRG: 054 | Disposition: A | Discharge status: Skilled Nursing Facility | Payer: No Typology Code available for payment source | Attending: Internal Medicine | Admitting: Internal Medicine

## 2018-07-02 ENCOUNTER — Telehealth: Payer: Self-pay | Admitting: *Deleted

## 2018-07-02 ENCOUNTER — Inpatient Hospital Stay (HOSPITAL_COMMUNITY): Payer: No Typology Code available for payment source

## 2018-07-02 DIAGNOSIS — Z1159 Encounter for screening for other viral diseases: Secondary | ICD-10-CM | POA: Diagnosis not present

## 2018-07-02 DIAGNOSIS — Z515 Encounter for palliative care: Secondary | ICD-10-CM

## 2018-07-02 DIAGNOSIS — C7970 Secondary malignant neoplasm of unspecified adrenal gland: Secondary | ICD-10-CM | POA: Diagnosis present

## 2018-07-02 DIAGNOSIS — C099 Malignant neoplasm of tonsil, unspecified: Secondary | ICD-10-CM | POA: Diagnosis present

## 2018-07-02 DIAGNOSIS — Z931 Gastrostomy status: Secondary | ICD-10-CM | POA: Diagnosis not present

## 2018-07-02 DIAGNOSIS — C7800 Secondary malignant neoplasm of unspecified lung: Secondary | ICD-10-CM | POA: Diagnosis present

## 2018-07-02 DIAGNOSIS — N433 Hydrocele, unspecified: Secondary | ICD-10-CM | POA: Diagnosis present

## 2018-07-02 DIAGNOSIS — C7931 Secondary malignant neoplasm of brain: Secondary | ICD-10-CM | POA: Diagnosis present

## 2018-07-02 DIAGNOSIS — L89319 Pressure ulcer of right buttock, unspecified stage: Secondary | ICD-10-CM | POA: Diagnosis not present

## 2018-07-02 DIAGNOSIS — L89152 Pressure ulcer of sacral region, stage 2: Secondary | ICD-10-CM | POA: Diagnosis present

## 2018-07-02 DIAGNOSIS — C779 Secondary and unspecified malignant neoplasm of lymph node, unspecified: Secondary | ICD-10-CM | POA: Diagnosis present

## 2018-07-02 DIAGNOSIS — R4182 Altered mental status, unspecified: Secondary | ICD-10-CM | POA: Diagnosis present

## 2018-07-02 DIAGNOSIS — N5089 Other specified disorders of the male genital organs: Secondary | ICD-10-CM

## 2018-07-02 DIAGNOSIS — C3490 Malignant neoplasm of unspecified part of unspecified bronchus or lung: Secondary | ICD-10-CM | POA: Diagnosis not present

## 2018-07-02 DIAGNOSIS — L8990 Pressure ulcer of unspecified site, unspecified stage: Secondary | ICD-10-CM | POA: Insufficient documentation

## 2018-07-02 DIAGNOSIS — R4020 Unspecified coma: Secondary | ICD-10-CM | POA: Diagnosis not present

## 2018-07-02 DIAGNOSIS — G936 Cerebral edema: Secondary | ICD-10-CM | POA: Diagnosis present

## 2018-07-02 DIAGNOSIS — Z7189 Other specified counseling: Secondary | ICD-10-CM | POA: Diagnosis not present

## 2018-07-02 DIAGNOSIS — L89001 Pressure ulcer of unspecified elbow, stage 1: Secondary | ICD-10-CM | POA: Diagnosis not present

## 2018-07-02 LAB — URINALYSIS, ROUTINE W REFLEX MICROSCOPIC
Bilirubin Urine: NEGATIVE
Glucose, UA: NEGATIVE mg/dL
Hgb urine dipstick: NEGATIVE
Ketones, ur: 20 mg/dL — AB
Leukocytes,Ua: NEGATIVE
Nitrite: NEGATIVE
Protein, ur: NEGATIVE mg/dL
Specific Gravity, Urine: 1.027 (ref 1.005–1.030)
pH: 5 (ref 5.0–8.0)

## 2018-07-02 LAB — CK: Total CK: 299 U/L (ref 49–397)

## 2018-07-02 LAB — COMPREHENSIVE METABOLIC PANEL
ALT: 18 U/L (ref 0–44)
AST: 24 U/L (ref 15–41)
Albumin: 3.6 g/dL (ref 3.5–5.0)
Alkaline Phosphatase: 85 U/L (ref 38–126)
Anion gap: 13 (ref 5–15)
BUN: 12 mg/dL (ref 8–23)
CO2: 23 mmol/L (ref 22–32)
Calcium: 8.8 mg/dL — ABNORMAL LOW (ref 8.9–10.3)
Chloride: 97 mmol/L — ABNORMAL LOW (ref 98–111)
Creatinine, Ser: 0.92 mg/dL (ref 0.61–1.24)
GFR calc Af Amer: >60 mL/min (ref >60)
GFR calc non Af Amer: >60 mL/min (ref >60)
Glucose, Bld: 110 mg/dL — ABNORMAL HIGH (ref 70–99)
Potassium: 3.8 mmol/L (ref 3.5–5.1)
Sodium: 133 mmol/L — ABNORMAL LOW (ref 135–145)
Total Bilirubin: 1 mg/dL (ref 0.3–1.2)
Total Protein: 6.3 g/dL — ABNORMAL LOW (ref 6.5–8.1)

## 2018-07-02 LAB — CBC WITH DIFFERENTIAL/PLATELET
Abs Immature Granulocytes: 0.01 10*3/uL (ref 0.00–0.07)
Basophils Absolute: 0 10*3/uL (ref 0.0–0.1)
Basophils Relative: 0 %
Eosinophils Absolute: 0 10*3/uL (ref 0.0–0.5)
Eosinophils Relative: 1 %
HCT: 39.1 % (ref 39.0–52.0)
Hemoglobin: 12.9 g/dL — ABNORMAL LOW (ref 13.0–17.0)
Immature Granulocytes: 0 %
Lymphocytes Relative: 5 %
Lymphs Abs: 0.3 10*3/uL — ABNORMAL LOW (ref 0.7–4.0)
MCH: 30.2 pg (ref 26.0–34.0)
MCHC: 33 g/dL (ref 30.0–36.0)
MCV: 91.6 fL (ref 80.0–100.0)
Monocytes Absolute: 0.4 10*3/uL (ref 0.1–1.0)
Monocytes Relative: 6 %
Neutro Abs: 5.1 10*3/uL (ref 1.7–7.7)
Neutrophils Relative %: 88 %
Platelets: 172 10*3/uL (ref 150–400)
RBC: 4.27 MIL/uL (ref 4.22–5.81)
RDW: 17.2 % — ABNORMAL HIGH (ref 11.5–15.5)
WBC: 5.9 10*3/uL (ref 4.0–10.5)
nRBC: 0 % (ref 0.0–0.2)

## 2018-07-02 LAB — SARS CORONAVIRUS 2 BY RT PCR (HOSPITAL ORDER, PERFORMED IN ~~LOC~~ HOSPITAL LAB): SARS Coronavirus 2: NEGATIVE

## 2018-07-02 LAB — MAGNESIUM: Magnesium: 1.7 mg/dL (ref 1.7–2.4)

## 2018-07-02 LAB — TROPONIN I: Troponin I: <0.03 ng/mL (ref <0.03)

## 2018-07-02 LAB — LACTIC ACID, PLASMA: Lactic Acid, Venous: 1.2 mmol/L (ref 0.5–1.9)

## 2018-07-02 LAB — LIPASE, BLOOD: Lipase: 24 U/L (ref 11–51)

## 2018-07-02 MED ORDER — ONDANSETRON HCL 4 MG/2ML IJ SOLN: 4.0000 mg | Freq: Four times a day (QID) | INTRAMUSCULAR | Status: DC | PRN

## 2018-07-02 MED ORDER — DEXAMETHASONE SODIUM PHOSPHATE 4 MG/ML IJ SOLN: 8.0000 mg | Freq: Three times a day (TID) | INTRAMUSCULAR | Status: DC

## 2018-07-02 MED ORDER — GADOBUTROL 1 MMOL/ML IV SOLN
10.0000 mL | Freq: Once | INTRAVENOUS | Status: AC | PRN
  Administered 2018-07-02: 10 mL via INTRAVENOUS

## 2018-07-02 MED ORDER — SODIUM CHLORIDE 0.9% FLUSH: 3.0000 mL | Freq: Once | INTRAVENOUS | Status: DC

## 2018-07-02 MED ORDER — DEXAMETHASONE SODIUM PHOSPHATE 4 MG/ML IJ SOLN: 4.0000 mg | Freq: Four times a day (QID) | INTRAMUSCULAR | Status: DC

## 2018-07-02 MED ORDER — PANTOPRAZOLE SODIUM 40 MG PO TBEC
40.0000 mg | DELAYED_RELEASE_TABLET | Freq: Every day | ORAL | Status: DC
  Administered 2018-07-03 – 2018-07-08 (×6): 40 mg via ORAL
  Filled 2018-07-02 (×6): qty 1

## 2018-07-02 MED ORDER — DEXAMETHASONE SODIUM PHOSPHATE 4 MG/ML IJ SOLN
4.0000 mg | Freq: Four times a day (QID) | INTRAMUSCULAR | Status: DC
  Administered 2018-07-02 – 2018-07-05 (×11): 4 mg via INTRAVENOUS
  Filled 2018-07-02 (×11): qty 1

## 2018-07-02 MED ORDER — DEXAMETHASONE SODIUM PHOSPHATE 10 MG/ML IJ SOLN
10.0000 mg | Freq: Once | INTRAMUSCULAR | Status: AC
  Administered 2018-07-02: 10 mg via INTRAVENOUS
  Filled 2018-07-02: qty 1

## 2018-07-02 NOTE — ED Notes (Signed)
Bed: WA20 Expected date:  Expected time:  Means of arrival:  Comments:  EMS 63 yo m CA gen weakness

## 2018-07-02 NOTE — ED Notes (Signed)
Pt was not able to stand for Orth[facility] static vitals.

## 2018-07-02 NOTE — ED Provider Notes (Signed)
[facility] DEPT Provider Note   CSN: [id number] Arrival date & time: 07/02/18  2376     History   Chief Complaint Chief Complaint  Patient presents with  . Weakness    HPI Larry Alcock is a 63 y.o. male.     The history is provided by the patient, the EMS personnel and the spouse. The history is limited by the condition of the patient (confusion).  Weakness Pt was seen at 0735.  Per EMS, pt's wife and pt:  Pt with gradual onset and worsening of persistent generalized weakness for the past 1 week, worse over the past several days. Pt apparently became weak "sometime yesterday afternoon" and sat on the bathroom floor, where EMS found him this morning. Pt states he did not have a syncopal episode, rather was just too generally fatigued/weak to stand. Pt also states he has had multiple episodes of diarrhea for the past 2 weeks. LD chemo 06/19/18. Denies fevers, no SOB/cough, no CP/palpitations, no abd pain, no N/V, no black or blood in stools, no focal motor weakness.    Past Medical History:  Diagnosis Date  . 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 Chemotherapy induced nausea and vomiting   . Chemotherapy induced neutropenia ([place])   . Herpes zoster   . Meningitis   . Metastatic cancer to brain [facility])     Patient Active Problem List   Diagnosis Date Noted  . Encounter for antineoplastic chemotherapy 05/14/2018  . Brain metastases ([place]) 05/02/2018  . Malignant neoplasm of unspecified part of unspecified bronchus or lung ([place]) 12/25/2017  . Neuroendocrine carcinoma metastatic to lung ([place]) 12/25/2017  . Goals of care, counseling/discussion 12/25/2017    Past Surgical History:  Procedure Laterality Date  . KNEE SURGERY    . LUNG SURGERY Right    for neuroendocrine tumor in [place] Medications    Prior to Admission medications   Medication Sig Start Date End Date Taking? Authorizing Provider  diphenoxylate-atropine  (LOMOTIL) 2.5-0.025 MG tablet Take 1 tablet by mouth 4 (four) times daily as needed for diarrhea or loose stools. Patient not taking: Reported on 06/19/2018 06/11/18   Heilingoetter, Cassandra L, PA-C  LORazepam (ATIVAN) 2 MG tablet Take 1 tablets PO about 20-30 minutes before wearing radiosurgery mask to help with anxiety. Do not drive while on this. 05/09/18   Gery Pray, MD  prochlorperazine (COMPAZINE) 10 MG tablet Take 1 tablet (10 mg total) by mouth every 6 (six) hours as needed for nausea or vomiting. 05/14/18   Curt Bears, MD    Family History History reviewed. No pertinent family history.  Social History Social History   Tobacco Use  . Smoking status: Never Smoker  . Smokeless tobacco: Never Used  Substance Use Topics  . Alcohol use: Not on file    Comment: rare, but not currently.   . Drug use: Never     Allergies   Patient has no known allergies.   Review of Systems Review of Systems  Unable to perform ROS: Mental status change  Neurological: Positive for weakness.     Physical Exam Updated Vital Signs BP 124/87 (BP Location: Right Arm)   Pulse 80   Temp 97.9 F (36.6 C) (Oral)   Resp 18   Wt 109 kg   SpO2 99%   BMI 30.04 kg/m   09:34 Orthostatic Vital Signs MM  Orthostatic Lying   BP- Lying: 131/95Abnormal  Pulse- Lying: 80      Orthostatic Sitting  BP- Sitting: 141/97Abnormal   Pulse- Sitting: 93      Orthostatic Standing at 0 minutes  BP- Standing at 0 minutes: (Pt not able to stand)      Physical Exam 0740: Physical examination:  Nursing notes reviewed; Vital signs and O2 SAT reviewed;  Constitutional: Well developed, Well nourished, Well hydrated, In no acute distress; Head:  Normocephalic, atraumatic; Eyes: EOMI, PERRL, No scleral icterus; ENMT: Mouth and pharynx normal, Mucous membranes moist; Neck: Supple, Full range of motion, No lymphadenopathy; Cardiovascular: Regular rate and rhythm, No gallop; Respiratory: Breath sounds clear  & equal bilaterally, No wheezes.  Speaking full sentences with ease, Normal respiratory effort/excursion; Chest: Nontender, Movement normal; Abdomen: Soft, Nontender, Nondistended, Normal bowel sounds; Genitourinary: No CVA tenderness; Extremities: Peripheral pulses normal, No tenderness, No edema, No calf edema or asymmetry.; Neuro: Awake, alert, confused re: time, events. No facial droop. Speech clear. Moves all extremities spontaneously and to command without apparent gross focal motor deficits.; Skin: Color normal, Warm, Dry.   ED Treatments / Results  Labs (all labs ordered are listed, but only abnormal results are displayed)   EKG    Radiology   Procedures Procedures (including critical care time)  Medications Ordered in ED Medications  sodium chloride flush (NS) 0.9 % injection 3 mL (has no administration in time range)     Initial Impression / Assessment and Plan / ED Course  I have reviewed the triage vital signs and the nursing notes.  Pertinent labs & imaging results that were available during my care of the patient were reviewed by me and considered in my medical decision making (see chart for details).     MDM Reviewed: previous chart, nursing note and vitals Reviewed previous: labs and ECG Interpretation: labs, ECG, x-ray and CT scan    Results for orders placed or performed during the hospital encounter of 07/02/18  Urine culture   Specimen: Urine, Random  Result Value Ref Range   Specimen Description      URINE, RANDOM Performed at [facility] [address], [place], [place] [address]    Special Requests      NONE Performed at [facility], [place] [address] [place], [place] [address]    Culture      NO GROWTH Performed at [facility], [place] [address], [place], [place] [address]    Report Status 07/03/2018 FINAL   SARS Coronavirus 2 (CEPHEID - Performed in [place] hospital lab), [facility] Order    Specimen: Nasopharyngeal Swab  Result Value Ref Range   SARS Coronavirus 2 NEGATIVE NEGATIVE  Urinalysis, Routine w reflex microscopic  Result Value Ref Range   Color, Urine AMBER (A) YELLOW   APPearance CLEAR CLEAR   Specific Gravity, Urine 1.027 1.005 - 1.030   pH 5.0 5.0 - 8.0   Glucose, UA NEGATIVE NEGATIVE mg/dL   Hgb urine dipstick NEGATIVE NEGATIVE   Bilirubin Urine NEGATIVE NEGATIVE   Ketones, ur 20 (A) NEGATIVE mg/dL   Protein, ur NEGATIVE NEGATIVE mg/dL   Nitrite NEGATIVE NEGATIVE   Leukocytes,Ua NEGATIVE NEGATIVE  Comprehensive metabolic panel  Result Value Ref Range   Sodium 133 (L) 135 - 145 mmol/L   Potassium 3.8 3.5 - 5.1 mmol/L   Chloride 97 (L) 98 - 111 mmol/L   CO2 23 22 - 32 mmol/L   Glucose, Bld 110 (H) 70 - 99 mg/dL   BUN 12 8 -  23 mg/dL   Creatinine, Ser 0.92 0.61 - 1.24 mg/dL   Calcium 8.8 (L) 8.9 - 10.3 mg/dL   Total Protein 6.3 (L) 6.5 - 8.1 g/dL   Albumin 3.6 3.5 - 5.0 g/dL   AST 24 15 - 41 U/L   ALT 18 0 - 44 U/L   Alkaline Phosphatase 85 38 - 126 U/L   Total Bilirubin 1.0 0.3 - 1.2 mg/dL   GFR calc non Af Amer >60 >60 mL/min   GFR calc Af Amer >60 >60 mL/min   Anion gap 13 5 - 15  Lipase, blood  Result Value Ref Range   Lipase 24 11 - 51 U/L  Troponin I - Once  Result Value Ref Range   Troponin I <0.03 <0.03 ng/mL  Lactic acid, plasma  Result Value Ref Range   Lactic Acid, Venous 1.2 0.5 - 1.9 mmol/L  CBC with Differential  Result Value Ref Range   WBC 5.9 4.0 - 10.5 K/uL   RBC 4.27 4.22 - 5.81 MIL/uL   Hemoglobin 12.9 (L) 13.0 - 17.0 g/dL   HCT 39.1 39.0 - 52.0 %   MCV 91.6 80.0 - 100.0 fL   MCH 30.2 26.0 - 34.0 pg   MCHC 33.0 30.0 - 36.0 g/dL   RDW 17.2 (H) 11.5 - 15.5 %   Platelets 172 150 - 400 K/uL   nRBC 0.0 0.0 - 0.2 %   Neutrophils Relative % 88 %   Neutro Abs 5.1 1.7 - 7.7 K/uL   Lymphocytes Relative 5 %   Lymphs Abs 0.3 (L) 0.7 - 4.0 K/uL   Monocytes Relative 6 %   Monocytes Absolute 0.4 0.1 - 1.0 K/uL   Eosinophils  Relative 1 %   Eosinophils Absolute 0.0 0.0 - 0.5 K/uL   Basophils Relative 0 %   Basophils Absolute 0.0 0.0 - 0.1 K/uL   Immature Granulocytes 0 %   Abs Immature Granulocytes 0.01 0.00 - 0.07 K/uL  CK  Result Value Ref Range   Total CK 299 49 - 397 U/L  Magnesium  Result Value Ref Range   Magnesium 1.7 1.7 - 2.4 mg/dL  HIV antibody (Routine Testing)  Result Value Ref Range   HIV Screen 4th Generation wRfx Non Reactive Non Reactive  TSH  Result Value Ref Range   TSH 0.528 0.350 - 4.500 uIU/mL  Basic metabolic panel  Result Value Ref Range   Sodium 133 (L) 135 - 145 mmol/L   Potassium 4.2 3.5 - 5.1 mmol/L   Chloride 97 (L) 98 - 111 mmol/L   CO2 24 22 - 32 mmol/L   Glucose, Bld 132 (H) 70 - 99 mg/dL   BUN 19 8 - 23 mg/dL   Creatinine, Ser 0.86 0.61 - 1.24 mg/dL   Calcium 9.2 8.9 - 10.3 mg/dL   GFR calc non Af Amer >60 >60 mL/min   GFR calc Af Amer >60 >60 mL/min   Anion gap 12 5 - 15  CBC  Result Value Ref Range   WBC 7.4 4.0 - 10.5 K/uL   RBC 4.55 4.22 - 5.81 MIL/uL   Hemoglobin 13.6 13.0 - 17.0 g/dL   HCT 42.0 39.0 - 52.0 %   MCV 92.3 80.0 - 100.0 fL   MCH 29.9 26.0 - 34.0 pg   MCHC 32.4 30.0 - 36.0 g/dL   RDW 17.2 (H) 11.5 - 15.5 %   Platelets 187 150 - 400 K/uL   nRBC 0.0 0.0 - 0.2 %  Magnesium  Result  Value Ref Range   Magnesium 2.2 1.7 - 2.4 mg/dL  Basic metabolic panel  Result Value Ref Range   Sodium 133 (L) 135 - 145 mmol/L   Potassium 5.0 3.5 - 5.1 mmol/L   Chloride 99 98 - 111 mmol/L   CO2 26 22 - 32 mmol/L   Glucose, Bld 127 (H) 70 - 99 mg/dL   BUN 20 8 - 23 mg/dL   Creatinine, Ser 0.85 0.61 - 1.24 mg/dL   Calcium 9.1 8.9 - 10.3 mg/dL   GFR calc non Af Amer >60 >60 mL/min   GFR calc Af Amer >60 >60 mL/min   Anion gap 8 5 - 15  CBC  Result Value Ref Range   WBC 5.9 4.0 - 10.5 K/uL   RBC 4.32 4.22 - 5.81 MIL/uL   Hemoglobin 13.3 13.0 - 17.0 g/dL   HCT 40.0 39.0 - 52.0 %   MCV 92.6 80.0 - 100.0 fL   MCH 30.8 26.0 - 34.0 pg   MCHC 33.3 30.0 -  36.0 g/dL   RDW 17.4 (H) 11.5 - 15.5 %   Platelets 176 150 - 400 K/uL   nRBC 0.0 0.0 - 0.2 %  Magnesium  Result Value Ref Range   Magnesium 2.2 1.7 - 2.4 mg/dL   Dg Chest 2 View Result Date: 07/02/2018 CLINICAL DATA:  Weakness, history stage IV lung cancer EXAM: CHEST - 2 VIEW COMPARISON:  Portable exam 0808 hours compared to CT chest 05/11/2018 FINDINGS: Normal heart size, mediastinal contours, and pulmonary vascularity. Mild chronic RIGHT basilar atelectasis. RIGHT suprahilar nodular density unchanged corresponding to known tumor. Lungs otherwise clear. No infiltrate, pleural effusion or pneumothorax. IMPRESSION: Known RIGHT upper lobe tumor and chronic RIGHT basilar atelectasis. No acute abnormalities. Electronically Signed   By: Lavonia Dana M.D.   On: 07/02/2018 08:15   Ct Head Wo Contrast Result Date: 07/02/2018 CLINICAL DATA:  Patient with known metastatic lung carcinoma with increased confusion and altered mental status EXAM: CT HEAD WITHOUT CONTRAST TECHNIQUE: Contiguous axial images were obtained from the base of the skull through the vertex without intravenous contrast. COMPARISON:  Brain MRI April 27, 2018 FINDINGS: Brain: Diffuse enlargement of the ventricles is a stable finding. The sulci appear unremarkable. There is extensive vasogenic edema throughout much of the right frontal lobe which was present previously but appears slightly more extensive. No other vasogenic edema is evident. The known multiple masses throughout the brain documented on prior MR are not delineated focally on this noncontrast enhanced study. There is no hemorrhage. There Is impression on the frontal horn the right lateral ventricle due to the edema, more extensive than on the previous study. No midline shift evident, however. No extra-axial fluid collections are appreciable. Vascular: No appreciable hyperdense vessel. There is mild calcification in each carotid siphon region. Skull: The bony calvarium appears intact.  Sinuses/Orbits: There is mucosal thickening in several ethmoid air cells. Other visualized paranasal sinuses are clear. Orbits appear symmetric bilaterally. Other: Visualized mastoid air cells are clear. IMPRESSION: 1. There is extensive vasogenic edema throughout the right frontal lobe which shows a slight increase compared to prior MR from April 2020. There is more mass effect on the frontal horn the right lateral ventricle compared to the previous study. The known mass in this area is not well delineated on this noncontrast enhanced study. 2. Diffuse ventricular enlargement remains. Suspect a degree of underlying normal pressure hydrocephalus. 3. Recent MR showed multiple masses throughout the brain which are not delineated on this  noncontrast enhanced study. If further evaluation of these masses and direct comparison of these masses compared to the previous study is felt to be warranted, would advise MR pre and post-contrast to further evaluate. 4. No acute infarct is demonstrable. No hemorrhage. No midline shift. 5.  Mild mucosal thickening noted in several ethmoid air cells. Electronically Signed   By: Lowella Grip III M.D.   On: 07/02/2018 08:57    Mathayus Stanbery was evaluated in Emergency Department on 07/02/2018 for the symptoms described in the history of present illness. He was evaluated in the context of the global COVID-19 pandemic, which necessitated consideration that the patient might be at risk for infection with the SARS-CoV-2 virus that causes COVID-19. Institutional protocols and algorithms that pertain to the evaluation of patients at risk for COVID-19 are in a state of rapid change based on information released by regulatory bodies including the CDC and federal and state organizations. These policies and algorithms were followed during the patient's care in the ED.   1515:  CT-H as above; awaiting MRI w/wo brain. IV decadron to be given in ED; pt will need admit. Sign out to Dr. Tomi Bamberger.    Final Clinical Impressions(s) / ED Diagnoses   Final diagnoses:  None    ED Discharge Orders    None       Francine Graven, DO 07/05/18 1107

## 2018-07-02 NOTE — ED Notes (Signed)
Patient transported to MRI 

## 2018-07-02 NOTE — ED Notes (Addendum)
MRI called this RN stating patient has been finished with his MRI and has been waiting in the hall "for an hour" to be picked up by ED staff. This RN notified MRI staff that currently I would be unable to get the patient. Will attempt to find a staff member currently available to pick up patient from MRI. MRI states they will not be transporting the patient back to the ED at this time and patient will be waiting for ED to transport.

## 2018-07-02 NOTE — ED Notes (Signed)
ED TO INPATIENT HANDOFF REPORT  Name/Age/Gender Dwayne Massey 63 y.o. male  Code Status   Home/SNF/Other Home  Chief Complaint weakness  Level of Care/Admitting Diagnosis ED Disposition    None      Medical History Past Medical History:  Diagnosis Date  . 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 Chemotherapy induced nausea and vomiting   . Chemotherapy induced neutropenia ([place])   . Herpes zoster   . Meningitis   . Metastatic cancer to brain [facility])     Allergies No Known Allergies  IV Location/Drains/Wounds Patient Lines/Drains/Airways Status   Active Line/Drains/Airways    Name:   Placement date:   Placement time:   Site:   Days:   Peripheral IV 07/02/18 Left Antecubital   07/02/18    -    Antecubital   less than 1          Labs/Imaging Results for orders placed or performed during the hospital encounter of 07/02/18 (from the past 48 hour(s))  Urinalysis, Routine w reflex microscopic     Status: Abnormal   Collection Time: 07/02/18  7:46 AM  Result Value Ref Range   Color, Urine AMBER (A) YELLOW    Comment: BIOCHEMICALS MAY BE AFFECTED BY COLOR   APPearance CLEAR CLEAR   Specific Gravity, Urine 1.027 1.005 - 1.030   pH 5.0 5.0 - 8.0   Glucose, UA NEGATIVE NEGATIVE mg/dL   Hgb urine dipstick NEGATIVE NEGATIVE   Bilirubin Urine NEGATIVE NEGATIVE   Ketones, ur 20 (A) NEGATIVE mg/dL   Protein, ur NEGATIVE NEGATIVE mg/dL   Nitrite NEGATIVE NEGATIVE   Leukocytes,Ua NEGATIVE NEGATIVE    Comment: Performed at [facility], [place] [address], [place], [place] [address]  Comprehensive metabolic panel     Status: Abnormal   Collection Time: 07/02/18  7:46 AM  Result Value Ref Range   Sodium 133 (L) 135 - 145 mmol/L   Potassium 3.8 3.5 - 5.1 mmol/L   Chloride 97 (L) 98 - 111 mmol/L   CO2 23 22 - 32 mmol/L   Glucose, Bld 110 (H) 70 - 99 mg/dL   BUN 12 8 - 23 mg/dL   Creatinine, Ser 0.92 0.61 - 1.24 mg/dL   Calcium 8.8 (L) 8.9 - 10.3 mg/dL   Total Protein  6.3 (L) 6.5 - 8.1 g/dL   Albumin 3.6 3.5 - 5.0 g/dL   AST 24 15 - 41 U/L   ALT 18 0 - 44 U/L   Alkaline Phosphatase 85 38 - 126 U/L   Total Bilirubin 1.0 0.3 - 1.2 mg/dL   GFR calc non Af Amer >60 >60 mL/min   GFR calc Af Amer >60 >60 mL/min   Anion gap 13 5 - 15    Comment: Performed at [facility], [place] [address], [place], [place] [address]  Lipase, blood     Status: None   Collection Time: 07/02/18  7:46 AM  Result Value Ref Range   Lipase 24 11 - 51 U/L    Comment: Performed at [facility], [place] [address] [place], [place] [address]  Troponin I - Once     Status: None   Collection Time: 07/02/18  7:46 AM  Result Value Ref Range   Troponin I <0.03 <0.03 ng/mL    Comment: Performed at [facility], [place] [address], [place], [place] [address]  Lactic acid, plasma     Status: None   Collection Time: 07/02/18  7:46 AM  Result Value Ref Range   Lactic Acid, Venous 1.2 0.5 - 1.9 mmol/L    Comment: Performed at [facility], [place] [address] [place], [place] [address]  CBC with Differential     Status: Abnormal   Collection Time: 07/02/18  7:46 AM  Result Value Ref Range   WBC 5.9 4.0 - 10.5 K/uL   RBC 4.27 4.22 - 5.81 MIL/uL   Hemoglobin 12.9 (L) 13.0 - 17.0 g/dL   HCT 39.1 39.0 - 52.0 %   MCV 91.6 80.0 - 100.0 fL   MCH 30.2 26.0 - 34.0 pg   MCHC 33.0 30.0 - 36.0 g/dL   RDW 17.2 (H) 11.5 - 15.5 %   Platelets 172 150 - 400 K/uL   nRBC 0.0 0.0 - 0.2 %   Neutrophils Relative % 88 %   Neutro Abs 5.1 1.7 - 7.7 K/uL   Lymphocytes Relative 5 %   Lymphs Abs 0.3 (L) 0.7 - 4.0 K/uL   Monocytes Relative 6 %   Monocytes Absolute 0.4 0.1 - 1.0 K/uL   Eosinophils Relative 1 %   Eosinophils Absolute 0.0 0.0 - 0.5 K/uL   Basophils Relative 0 %   Basophils Absolute 0.0 0.0 - 0.1 K/uL   Immature Granulocytes 0 %   Abs Immature Granulocytes 0.01 0.00 - 0.07 K/uL    Comment: Performed at [facility], [place] [address], [place], [place] [address]  CK     Status: None   Collection Time: 07/02/18  7:46 AM  Result Value Ref Range   Total CK 299 49 - 397 U/L    Comment: Performed at [facility], [place] [address] [place], [place] [address]  Magnesium     Status: None   Collection Time: 07/02/18  7:46 AM  Result Value Ref Range   Magnesium 1.7 1.7 - 2.4 mg/dL    Comment: Performed at [facility], [place] [address], [place], [place] [address]  SARS Coronavirus 2 (CEPHEID - Performed in [place] hospital lab), Hosp Order     Status: None   Collection Time: 07/02/18  9:16 AM   Specimen: Nasopharyngeal Swab  Result Value Ref Range   SARS Coronavirus 2 NEGATIVE NEGATIVE    Comment: (NOTE) If result is NEGATIVE SARS-CoV-2 target nucleic acids are NOT DETECTED. The SARS-CoV-2 RNA is generally detectable in upper and lower  respiratory specimens during the acute phase of infection. The lowest  concentration of SARS-CoV-2 viral copies this assay can detect is 250  copies / mL. A negative result does not preclude SARS-CoV-2 infection  and should not be used as the sole basis for treatment or other  patient management decisions.  A negative result may occur with  improper specimen collection / handling, submission of specimen other  than nasopharyngeal swab, presence of viral mutation(s) within the  areas targeted by this assay, and inadequate number of viral copies  (<250 copies / mL). A negative result must be combined with clinical  observations, patient history, and epidemiological information. If result is POSITIVE SARS-CoV-2 target nucleic acids are DETECTED. The SARS-CoV-2 RNA is generally detectable in upper and lower  respiratory specimens dur ing the acute phase of infection.  Positive  results are indicative of active infection with SARS-CoV-2.  Clinical  correlation with patient history and other diagnostic information is  necessary to  determine patient infection status.  Positive results do  not rule out bacterial infection or co-infection with other viruses. If  result is PRESUMPTIVE POSTIVE SARS-CoV-2 nucleic acids MAY BE PRESENT.   A presumptive positive result was obtained on the submitted specimen  and confirmed on repeat testing.  While 2019 novel coronavirus  (SARS-CoV-2) nucleic acids may be present in the submitted sample  additional confirmatory testing may be necessary for epidemiological  and / or clinical management purposes  to differentiate between  SARS-CoV-2 and other Sarbecovirus currently known to infect humans.  If clinically indicated additional testing with an alternate test  methodology [phone]) is advised. The SARS-CoV-2 RNA is generally  detectable in upper and lower respiratory sp ecimens during the acute  phase of infection. The expected result is Negative. Fact Sheet for Patients:  StrictlyIdeas.no Fact Sheet for Healthcare Providers: BankingDealers.co.za This test is not yet approved or cleared by the [place] FDA and has been authorized for detection and/or diagnosis of SARS-CoV-2 by FDA under an Emergency Use Authorization (EUA).  This EUA will remain in effect (meaning this test can be used) for the duration of the COVID-19 declaration under Section 564(b)(1) of the Act, 21 U.S.C. section 360bbb-3(b)(1), unless the authorization is terminated or revoked sooner. Performed at [facility], [place] [address], [place], [place] [address]    Dg Chest 2 View  Result Date: 07/02/2018 CLINICAL DATA:  Weakness, history stage IV lung cancer EXAM: CHEST - 2 VIEW COMPARISON:  Portable exam 0808 hours compared to CT chest 05/11/2018 FINDINGS: Normal heart size, mediastinal contours, and pulmonary vascularity. Mild chronic RIGHT basilar atelectasis. RIGHT suprahilar nodular density unchanged corresponding to known tumor. Lungs  otherwise clear. No infiltrate, pleural effusion or pneumothorax. IMPRESSION: Known RIGHT upper lobe tumor and chronic RIGHT basilar atelectasis. No acute abnormalities. Electronically Signed   By: Lavonia Dana M.D.   On: 07/02/2018 08:15   Ct Head Wo Contrast  Result Date: 07/02/2018 CLINICAL DATA:  Patient with known metastatic lung carcinoma with increased confusion and altered mental status EXAM: CT HEAD WITHOUT CONTRAST TECHNIQUE: Contiguous axial images were obtained from the base of the skull through the vertex without intravenous contrast. COMPARISON:  Brain MRI April 27, 2018 FINDINGS: Brain: Diffuse enlargement of the ventricles is a stable finding. The sulci appear unremarkable. There is extensive vasogenic edema throughout much of the right frontal lobe which was present previously but appears slightly more extensive. No other vasogenic edema is evident. The known multiple masses throughout the brain documented on prior MR are not delineated focally on this noncontrast enhanced study. There is no hemorrhage. There Is impression on the frontal horn the right lateral ventricle due to the edema, more extensive than on the previous study. No midline shift evident, however. No extra-axial fluid collections are appreciable. Vascular: No appreciable hyperdense vessel. There is mild calcification in each carotid siphon region. Skull: The bony calvarium appears intact. Sinuses/Orbits: There is mucosal thickening in several ethmoid air cells. Other visualized paranasal sinuses are clear. Orbits appear symmetric bilaterally. Other: Visualized mastoid air cells are clear. IMPRESSION: 1. There is extensive vasogenic edema throughout the right frontal lobe which shows a slight increase compared to prior MR from April 2020. There is more mass effect on the frontal horn the right lateral ventricle compared to the previous study. The known mass in this area is not well delineated on this noncontrast enhanced study. 2.  Diffuse ventricular enlargement remains. Suspect a degree of underlying normal pressure hydrocephalus. 3. Recent MR showed multiple masses throughout the brain which are not delineated on this noncontrast enhanced study. If further evaluation  of these masses and direct comparison of these masses compared to the previous study is felt to be warranted, would advise MR pre and post-contrast to further evaluate. 4. No acute infarct is demonstrable. No hemorrhage. No midline shift. 5.  Mild mucosal thickening noted in several ethmoid air cells. Electronically Signed   By: Lowella Grip III M.D.   On: 07/02/2018 08:57    Pending Labs Unresulted Labs (From admission, onward)    Start     Ordered   07/02/18 0741  Urine culture  ONCE - STAT,   STAT     07/02/18 0740          Vitals/Pain Today's Vitals   07/02/18 1030 07/02/18 1100 07/02/18 1130 07/02/18 1200  BP: (!) 131/96 122/85 118/89 (!) 134/91  Pulse: 69 70 70 70  Resp: 19 17 20  (!) 24  Temp:      TempSrc:      SpO2: 100% 100% 100% 100%  Weight:        Isolation Precautions No active isolations  Medications Medications - No data to display  Mobility non-ambulatory

## 2018-07-02 NOTE — ED Notes (Signed)
EKG given to Dr Thurnell Garbe,

## 2018-07-02 NOTE — ED Notes (Signed)
US at bedside

## 2018-07-02 NOTE — ED Triage Notes (Signed)
Patient BIB EMS from home where he lives with wife. Hx of stage 4 cancer of lungs, being treated by [facility]. Patient was supposed to have chemo last week, but was unable due to dehydration. Patient wife reports patient has increased weakness and confusion over the past few days. Yesterday "sometime in the afternoon," patient became weak walking out of the bathroom and had to sit on the floor due to weakness. Patient has been unable to get up all night and was found on the floor by EMS. Patient denies pain for EMS. Patient denies LOC.  20G Left AC PIV placed by EMS.   EMS VS: 121/90, 94SR, 96% RA, CBG=117, T 97.7F temporal.

## 2018-07-02 NOTE — ED Notes (Signed)
Pt provided with peri care and a bed change due to a incontinent episode. Pt able to roll in bed with assistance from staff.

## 2018-07-02 NOTE — Telephone Encounter (Signed)
Received vm message from pt's wife. She states pt in currently in the ED @ [facility]. She called EMS this am because pt was very confused and sitting on bathroom floor. She could not get him up. Dr. Julien Nordmann made aware

## 2018-07-02 NOTE — ED Notes (Addendum)
Writer went to transport pt from MRI, pt obtained from the hallway in MRI. Pt given instructions on how to keep his face mask on while in hallway prior to transport. Pt back in room at this time with mask on and back on cardiac monitoring.

## 2018-07-02 NOTE — ED Notes (Signed)
MRI called and stated staff could not bring pt back, I notified nurse. Nurse was in patients room starting line and could not go, emergency room was busy at the time.

## 2018-07-02 NOTE — H&P (Signed)
History and Physical    Dwayne Massey HYQ:[id number] DOB: March 16, 1955 DOA: 07/02/2018  PCP: Patient, No Pcp Per   Patient coming from: Home    Chief Complaint: Confusion, generalized weakness, unsteady gait  HPI: Dwayne Massey is a 63 y.o. 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 Patient was initially diagnosed with malignancy in 2016 in [place].  Currently on chemotherapy and follows with Dr. Julien Nordmann.  His last chemotherapy was 2 weeks ago.  He has ongoing issues with confusion and weakness but since last Saturday he has gone steadily downhill.  He has severe problem with his short-term memory.  Yesterday he fell on the floor and could not stand up.  He remained on the floor most of the time yesterday.  Wife then brought the patient to the emergency department today. Patient seen and examined the bedside in the emergency department.  Currently comfortable.  Hemodynamically stable.  Alert and oriented to place and person only.  Denies any complaints like fever, chills, shortness of breath, chest pain cough, palpitations, abdomen pain, nausea, vomiting or diarrhea.  ED Course: MRI done.  Finding as below.  Started on IV Decadron  Review of Systems: As per HPI otherwise 10 point review of systems negative.    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 He has never used smokeless tobacco. He reports that he does not use drugs.  No history on file for alcohol.  No Known Allergies  History reviewed. No pertinent family history.   Prior to Admission medications   Medication Sig Start Date End Date Taking? Authorizing Provider  naproxen sodium (ALEVE) 220 MG tablet Take 440 mg by mouth 2 (two) times daily as needed (pain).   Yes [provider]  prochlorperazine (COMPAZINE) 10 MG tablet Take 1 tablet (10 mg total) by mouth every 6 (six) hours as needed for nausea or vomiting. 05/14/18  Yes Curt Bears, MD  diphenoxylate-atropine (LOMOTIL) 2.5-0.025 MG tablet Take 1 tablet by mouth 4 (four) times daily as needed for diarrhea or loose stools. Patient not taking: Reported on 06/19/2018 06/11/18   Heilingoetter, Cassandra L, PA-C  LORazepam (ATIVAN) 2 MG tablet Take 1 tablets PO about 20-30 minutes before wearing radiosurgery mask to help with anxiety. Do not drive while on this. Patient not taking: Reported on 07/02/2018 05/09/18   Gery Pray, MD    Physical Exam: Vitals:   07/02/18 1344 07/02/18 1354 07/02/18 1601 07/02/18 1630  BP: (!) 123/92 (!) 123/92 125/89 114/81  Pulse: 74 67 75 74  Resp: 18 16 15 20   Temp:   97.6 F (36.4 C)   TempSrc:   Oral   SpO2: 100% 100% 100% 100%  Weight:        Constitutional: Calm, comfortable Vitals:   07/02/18 1344 07/02/18 1354 07/02/18 1601 07/02/18 1630  BP: (!) 123/92 Marland Kitchen)  123/92 125/89 114/81  Pulse: 74 67 75 74  Resp: 18 16 15 20   Temp:   97.6 F (36.4 C)   TempSrc:   Oral   SpO2: 100% 100% 100% 100%  Weight:       Eyes: PERRL, lids and conjunctivae normal ENMT: Mucous membranes are moist. Posterior pharynx clear of any exudate or lesions.Normal dentition.  Neck: normal, supple, no masses, no thyromegaly Respiratory: clear to auscultation bilaterally, no wheezing, no crackles. Normal respiratory effort. No accessory muscle use.  Cardiovascular: Regular rate and rhythm, no murmurs / rubs / gallops. No extremity edema. 2+ pedal pulses. No carotid bruits.   Abdomen: no tenderness, no masses palpated. No hepatosplenomegaly. Bowel sounds positive.  Musculoskeletal: no clubbing / cyanosis. No joint deformity upper and lower extremities. Good ROM, no contractures. Normal muscle tone.  Skin: no rashes, lesions, ulcers. No induration Neurologic: CN 2-12 grossly intact. Sensation intact, DTR normal. Strength 5/5 in all 4.  Psychiatric: Normal judgment and insight. Alert and oriented x 3. Normal mood.   GU: Enlarged left testicle  Labs on Admission: I have personally reviewed following labs and imaging studies  CBC: Recent Labs  Lab 06/27/18 1503 07/02/18 0746  WBC 2.2* 5.9  NEUTROABS 1.6* 5.1  HGB 13.6 12.9*  HCT 40.5 39.1  MCV 89.6 91.6  PLT 313 161   Basic Metabolic Panel: Recent Labs  Lab 06/27/18 1503 07/02/18 0746  NA 132* 133*  K 3.8 3.8  CL 100 97*  CO2 24 23  GLUCOSE 140* 110*  BUN 13 12  CREATININE 0.94 0.92  CALCIUM 9.2 8.8*  MG 1.6* 1.7   GFR: Estimated Creatinine Clearance: 108.2 mL/min (by C-G formula based on SCr of 0.92 mg/dL). Liver Function Tests: Recent Labs  Lab 06/27/18 1503 07/02/18 0746  AST 16 24  ALT 13 18  ALKPHOS 104 85  BILITOT 0.5 1.0  PROT 6.8 6.3*  ALBUMIN 3.6 3.6   Recent Labs  Lab 07/02/18 0746  LIPASE 24   No results for input(s): AMMONIA in the last 168 hours. Coagulation Profile: No results for input(s): INR, PROTIME in the last 168 hours. Cardiac Enzymes: Recent Labs  Lab 07/02/18 0746  CKTOTAL 299  TROPONINI <0.03   BNP (last 3 results) No results for input(s): PROBNP in the last 8760 hours. HbA1C: No results for input(s): HGBA1C in the last 72 hours. CBG: No results for input(s): GLUCAP in the last 168 hours. Lipid Profile: No results for input(s): CHOL, HDL, LDLCALC, TRIG, CHOLHDL, LDLDIRECT in the last 72 hours. Thyroid Function Tests: No results for input(s): TSH, T4TOTAL, FREET4, T3FREE, THYROIDAB in the last 72 hours. Anemia Panel: No results for input(s):  VITAMINB12, FOLATE, FERRITIN, TIBC, IRON, RETICCTPCT in the last 72 hours. Urine analysis:    Component Value Date/Time   COLORURINE AMBER (A) 07/02/2018 0746   APPEARANCEUR CLEAR 07/02/2018 0746   LABSPEC 1.027 07/02/2018 0746   PHURINE 5.0 07/02/2018 0746   GLUCOSEU NEGATIVE 07/02/2018 0746   HGBUR NEGATIVE 07/02/2018 0746   BILIRUBINUR NEGATIVE 07/02/2018 0746   KETONESUR 20 (A) 07/02/2018 0746   PROTEINUR NEGATIVE 07/02/2018 0746   NITRITE NEGATIVE 07/02/2018 0746   LEUKOCYTESUR NEGATIVE 07/02/2018 0746    Radiological Exams on Admission: Dg Chest 2 View  Result Date: 07/02/2018 CLINICAL DATA:  Weakness, history stage IV lung cancer EXAM: CHEST - 2 VIEW COMPARISON:  Portable exam 0808 hours compared to CT chest 05/11/2018 FINDINGS: Normal heart size, mediastinal contours, and pulmonary vascularity. Mild chronic RIGHT basilar  atelectasis. RIGHT suprahilar nodular density unchanged corresponding to known tumor. Lungs otherwise clear. No infiltrate, pleural effusion or pneumothorax. IMPRESSION: Known RIGHT upper lobe tumor and chronic RIGHT basilar atelectasis. No acute abnormalities. Electronically Signed   By: Lavonia Dana M.D.   On: 07/02/2018 08:15   Ct Head Wo Contrast  Result Date: 07/02/2018 CLINICAL DATA:  Patient with known metastatic lung carcinoma with increased confusion and altered mental status EXAM: CT HEAD WITHOUT CONTRAST TECHNIQUE: Contiguous axial images were obtained from the base of the skull through the vertex without intravenous contrast. COMPARISON:  Brain MRI April 27, 2018 FINDINGS: Brain: Diffuse enlargement of the ventricles is a stable finding. The sulci appear unremarkable. There is extensive vasogenic edema throughout much of the right frontal lobe which was present previously but appears slightly more extensive. No other vasogenic edema is evident. The known multiple masses throughout the brain documented on prior MR are not delineated focally on this  noncontrast enhanced study. There is no hemorrhage. There Is impression on the frontal horn the right lateral ventricle due to the edema, more extensive than on the previous study. No midline shift evident, however. No extra-axial fluid collections are appreciable. Vascular: No appreciable hyperdense vessel. There is mild calcification in each carotid siphon region. Skull: The bony calvarium appears intact. Sinuses/Orbits: There is mucosal thickening in several ethmoid air cells. Other visualized paranasal sinuses are clear. Orbits appear symmetric bilaterally. Other: Visualized mastoid air cells are clear. IMPRESSION: 1. There is extensive vasogenic edema throughout the right frontal lobe which shows a slight increase compared to prior MR from April 2020. There is more mass effect on the frontal horn the right lateral ventricle compared to the previous study. The known mass in this area is not well delineated on this noncontrast enhanced study. 2. Diffuse ventricular enlargement remains. Suspect a degree of underlying normal pressure hydrocephalus. 3. Recent MR showed multiple masses throughout the brain which are not delineated on this noncontrast enhanced study. If further evaluation of these masses and direct comparison of these masses compared to the previous study is felt to be warranted, would advise MR pre and post-contrast to further evaluate. 4. No acute infarct is demonstrable. No hemorrhage. No midline shift. 5.  Mild mucosal thickening noted in several ethmoid air cells. Electronically Signed   By: Lowella Grip III M.D.   On: 07/02/2018 08:57   Mr Jeri Cos And Wo Contrast  Result Date: 07/02/2018 CLINICAL DATA:  Metastatic disease with progression on CT. EXAM: MRI HEAD WITHOUT AND WITH CONTRAST TECHNIQUE: Multiplanar, multiecho pulse sequences of the brain and surrounding structures were obtained without and with intravenous contrast. CONTRAST:  10 mL Gadovist IV COMPARISON:  CT head 07/02/2018,  MRI head 04/27/2018 FINDINGS: Brain: Image quality degraded by significant motion. Marked ventricular enlargement similar to the prior MRI. Marked dilatation of the lateral third and fourth ventricles suggesting communicating hydrocephalus. This could be due to leptomeningeal tumor although I do not see leptomeningeal enhancement. Large enhancing mass right frontal lobe has enlarged now measuring 3.9 x 4.2 cm. Progression of surrounding edema and mass-effect on the right frontal horn. Numerous enhancing lesions are seen on the prior MRI which are not well identified on today's study due to extensive motion. Right temporal lesion may have enlarged in the interval. Negative for hemorrhage. No acute infarct. Vascular: Normal arterial flow voids Skull and upper cervical spine: Negative Sinuses/Orbits: Negative Other: None IMPRESSION: Image quality degraded by significant motion. This significantly limits evaluation of known metastatic disease  to brain. Large mass in the right frontal lobe shows significant interval enlargement since the prior MRI of 04/27/2018, now measuring 3.9 x 4.2 cm. Increased edema and mass-effect. Multiple additional enhancing lesions on the prior MRI are not well seen on today's study likely due to motion. Severe communicating hydrocephalus unchanged. While this could be due to leptomeningeal carcinomatosis and obstruction of CSF resorption, no MRI findings of leptomeningeal carcinomatosis are identified. Electronically Signed   By: Franchot Gallo M.D.   On: 07/02/2018 15:29     Assessment/Plan Principal Problem:   AMS (altered mental status) Active Problems:   Malignant neoplasm of unspecified part of unspecified bronchus or lung ([facility]CC)   Brain metastases ([facility]CC)   Brain edema ([facility]CC)    Altered mental status: Secondary to brain metastasis.  Continue to monitor.  Currently alert and oriented to place and person only.  Very engaged in communication.   Metastatic neuroendocrine  carcinoma :Thought to be initially metastatic disease from the right tonsil but has extensive metastatic disease in the lungs and mediatinal lymph nodes and also on bone, brain and adrenal glands. He was initially diagnosed with above  in 2016 years.  Currently seeing Dr. Julien Nordmann and is on chemotherapy.  He has been treated with several chemotherapy regimens as well as immunotherapy in the past.  Despite extensive treatment, patient continued to show evidence of disease progression.  Currently on cisplatin and  Irinotecan.  Brain metastasis:MRI showed large mass in the right frontal lobe shows significant interval enlargement since the prior MRI of 04/27/2018, now measuring 3.9 x4.2 cm. Increased edema and mass-effect.  Severe commuting hydrocephalus. This is the etiology behind altered mental status, unsteady gait. Will start on IV Decadron. I have requested for oncology evaluation tomorrow. I also discussed CODE STATUS, concept of palliative care and hospice with the wife.  She states she will discuss with her children and the patient and will get back to [place].  In the meantime I have requested for palliative care evaluation.  Currently remains full code  Generalized weakness/fall/unsteady gait: Found on the floor at home.  Will request for physical therapy evaluation.  Left testicular enlargement: We will request for US scrotum    Severity of Illness: The appropriate patient status for this patient is INPATIENT.  DVT prophylaxis: SCD Code Status: Full Family Communication: Discussed with wife on phone Consults called: Oncology     Shelly Coss MD [facility] Pager [phone]  If 7PM-7AM, please contact night-coverage [email or web address] Password [facility]  07/02/2018, 4:49 PM

## 2018-07-02 NOTE — ED Notes (Signed)
Patient transported to x-ray. ?

## 2018-07-02 NOTE — ED Provider Notes (Addendum)
Pt presents with increasing confusion.   CT scan shows increasing mets with edema.   Will start dose of decadron.  Consult with hospitalist for admission.  Consult with oncology.   Dorie Rank, MD 07/02/18 1555   Discussed with Dr.  Lindi Adie.  Will make Dr Earlie Server aware.  I spoke with the pt's wife and updated her on the findings   Dorie Rank, MD 07/02/18 1640

## 2018-07-02 NOTE — ED Notes (Signed)
MRI called this RN stating they could scan the patient, but would be unable to come transport the patient to MRI. This RN and fellow ED staff currently unable to transport patient. MRI made aware. MRI staff Thomasena Edis states if the ED can bring the patient to the MRI by 1400, the patient will be able to be scanned then. EDMD McMannus made aware.

## 2018-07-02 NOTE — ED Notes (Signed)
MRI called this RN. Stated they could scan the patient now, but could not come get the patient. Currently, the ED is unable to transport patient to MRI. MRI states they will "just have to wait" to scan the patient then. MD made aware.

## 2018-07-03 ENCOUNTER — Inpatient Hospital Stay (HOSPITAL_COMMUNITY): Payer: No Typology Code available for payment source

## 2018-07-03 ENCOUNTER — Ambulatory Visit: Payer: BLUE CROSS/BLUE SHIELD

## 2018-07-03 ENCOUNTER — Other Ambulatory Visit: Payer: BLUE CROSS/BLUE SHIELD

## 2018-07-03 ENCOUNTER — Ambulatory Visit (HOSPITAL_COMMUNITY): Admission: RE | Admit: 2018-07-03 | Payer: No Typology Code available for payment source | Source: Ambulatory Visit

## 2018-07-03 ENCOUNTER — Other Ambulatory Visit: Payer: Self-pay | Admitting: Radiation Therapy

## 2018-07-03 ENCOUNTER — Other Ambulatory Visit: Payer: Self-pay

## 2018-07-03 ENCOUNTER — Ambulatory Visit: Payer: BLUE CROSS/BLUE SHIELD | Admitting: Physician Assistant

## 2018-07-03 ENCOUNTER — Inpatient Hospital Stay: Payer: BC Managed Care – PPO

## 2018-07-03 DIAGNOSIS — Z515 Encounter for palliative care: Secondary | ICD-10-CM

## 2018-07-03 DIAGNOSIS — G936 Cerebral edema: Secondary | ICD-10-CM

## 2018-07-03 DIAGNOSIS — C3490 Malignant neoplasm of unspecified part of unspecified bronchus or lung: Secondary | ICD-10-CM

## 2018-07-03 DIAGNOSIS — L8990 Pressure ulcer of unspecified site, unspecified stage: Secondary | ICD-10-CM | POA: Insufficient documentation

## 2018-07-03 DIAGNOSIS — C7931 Secondary malignant neoplasm of brain: Principal | ICD-10-CM

## 2018-07-03 DIAGNOSIS — Z7189 Other specified counseling: Secondary | ICD-10-CM

## 2018-07-03 LAB — URINE CULTURE: Culture: NO GROWTH

## 2018-07-03 LAB — TSH: TSH: 0.528 u[IU]/mL (ref 0.350–4.500)

## 2018-07-03 MED ORDER — GUAIFENESIN-DM 100-10 MG/5ML PO SYRP: 5.0000 mL | ORAL_SOLUTION | ORAL | Status: DC | PRN

## 2018-07-03 MED ORDER — IOHEXOL 300 MG/ML  SOLN
100.0000 mL | Freq: Once | INTRAMUSCULAR | Status: AC | PRN
  Administered 2018-07-03: 100 mL via INTRAVENOUS

## 2018-07-03 MED ORDER — HYDRALAZINE HCL 20 MG/ML IJ SOLN: 10.0000 mg | INTRAMUSCULAR | Status: DC | PRN

## 2018-07-03 MED ORDER — ALUM & MAG HYDROXIDE-SIMETH 200-200-20 MG/5ML PO SUSP: 30.0000 mL | ORAL | Status: DC | PRN

## 2018-07-03 MED ORDER — HYDROCORTISONE (PERIANAL) 2.5 % EX CREA
1.0000 "application " | TOPICAL_CREAM | Freq: Four times a day (QID) | CUTANEOUS | Status: DC | PRN
  Filled 2018-07-03: qty 28.35

## 2018-07-03 MED ORDER — MUSCLE RUB 10-15 % EX CREA
1.0000 "application " | TOPICAL_CREAM | CUTANEOUS | Status: DC | PRN
  Filled 2018-07-03: qty 85

## 2018-07-03 MED ORDER — SENNOSIDES-DOCUSATE SODIUM 8.6-50 MG PO TABS: 2.0000 | ORAL_TABLET | Freq: Every evening | ORAL | Status: DC | PRN

## 2018-07-03 MED ORDER — POLYVINYL ALCOHOL 1.4 % OP SOLN
1.0000 [drp] | OPHTHALMIC | Status: DC | PRN
  Filled 2018-07-03: qty 15

## 2018-07-03 MED ORDER — POLYETHYLENE GLYCOL 3350 17 G PO PACK: 17.0000 g | PACK | Freq: Every day | ORAL | Status: DC | PRN

## 2018-07-03 MED ORDER — SALINE SPRAY 0.65 % NA SOLN
1.0000 | NASAL | Status: DC | PRN
  Filled 2018-07-03: qty 44

## 2018-07-03 MED ORDER — PHENOL 1.4 % MT LIQD
1.0000 | OROMUCOSAL | Status: DC | PRN
  Filled 2018-07-03: qty 177

## 2018-07-03 MED ORDER — LIP MEDEX EX OINT: 1.0000 "application " | TOPICAL_OINTMENT | CUTANEOUS | Status: DC | PRN

## 2018-07-03 MED ORDER — LORATADINE 10 MG PO TABS: 10.0000 mg | ORAL_TABLET | Freq: Every day | ORAL | Status: DC | PRN

## 2018-07-03 MED ORDER — IOHEXOL 300 MG/ML  SOLN
30.0000 mL | Freq: Once | INTRAMUSCULAR | Status: AC | PRN
  Administered 2018-07-03: 30 mL via INTRAVENOUS

## 2018-07-03 MED ORDER — HYDROCORTISONE 1 % EX CREA
1.0000 "application " | TOPICAL_CREAM | Freq: Three times a day (TID) | CUTANEOUS | Status: DC | PRN
  Filled 2018-07-03: qty 28

## 2018-07-03 NOTE — Progress Notes (Signed)
PROGRESS NOTE    Dwayne Massey  MOQ:[id number] DOB: [birth year] DOA: 07/02/2018 PCP: Patient, No Pcp Per   Brief Narrative:   63 year old with history of lung cancer with brain metastases, neuroendocrine tumor presented to the hospital with worsening confusion and weakness.  MRI performed in the hospital showed large mass in the right frontal lobe which is enlarged since April.  Oncology team consulted.  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  Currently started on Decadron 4 mg every 6 hours.  Oncology team has been consulted, will need radiation oncology involved.  Continue supportive care at this time.  Apparently he has next chemo cycle planned at the end of the month. Unsure about his prognosis but I have encouraged the patient to start thinking about goals of care and have discussion with his wife.  Patient and family both aware of this and will start having this discussion.  Left testicular scrotal enlargement with moderate left-sided hydrocele -Supportive care.  No evidence of epididymitis or testicular torsion.  Continue to monitor.  Altered mental status and generalized weakness -Secondary to large frontal lobe mass.  PT/OT.  DVT prophylaxis: SCDs Code Status: Full  Family Communication:  Spoke with his wife [name] Disposition Plan: [place] stay  Consultants:   Oncology  Radiation Oncology  Procedures:   None  Antimicrobials:   None   Subjective: Patient feels overall weak. Asking me about his prognosis but I had requested him to defer this question to oncology team.  Review of Systems Otherwise  negative except as per HPI, including: General: Denies fever, chills, night sweats or unintended weight loss. Resp: Denies cough, wheezing, shortness of breath. Cardiac: Denies chest pain, palpitations, orthopnea, paroxysmal nocturnal dyspnea. GI: Denies abdominal pain, nausea, vomiting, diarrhea or constipation GU: Denies dysuria, frequency, hesitancy or incontinence MS: Denies muscle aches, joint pain or swelling Neuro: Denies headache, neurologic deficits (focal weakness, numbness, tingling), abnormal gait Psych: Denies anxiety, depression, SI/HI/AVH Skin: Denies new rashes or lesions ID: Denies sick contacts, exotic exposures, travel  Objective: Vitals:   07/02/18 1800 07/02/18 1900 07/02/18 1953 07/03/18 0440  BP: 127/81 110/73  107/72  Pulse: 72 80  72  Resp: 19 20  18   Temp:    97.9 F (36.6 C)  TempSrc:    Oral  SpO2: 99% 99%  97%  Weight:   103 kg   Height:   6\' 4"  (1.93 m)     Intake/Output Summary (Last 24 hours) at 07/03/2018 1105 Last data filed at 07/03/2018 0500 Gross per 24 hour  Intake --  Output 225 ml  Net -225 ml   Filed Weights   07/02/18 0727 07/02/18 1953  Weight: 109 kg 103 kg    Examination:  General exam: Appears calm and comfortable  Respiratory system: Clear to auscultation. Respiratory effort normal. Cardiovascular system: S1 & S2 heard, RRR. No JVD, murmurs, rubs, gallops or clicks. No pedal edema. Gastrointestinal system: Abdomen is nondistended, soft and nontender. No organomegaly or masses felt. Normal bowel sounds heard. Central nervous system: Alert and oriented. No focal neurological deficits. Extremities: Symmetric 4 x 5 power. Skin: No rashes, lesions or  ulcers Psychiatry: Judgement and insight appear normal. Mood & affect appropriate.     Data Reviewed:   CBC: Recent Labs  Lab 06/27/18 1503 07/02/18 0746  WBC 2.2* 5.9  NEUTROABS 1.6* 5.1  HGB 13.6 12.9*  HCT 40.5 39.1  MCV 89.6 91.6  PLT 313 144   Basic Metabolic  Panel: Recent Labs  Lab 06/27/18 1503 07/02/18 0746  NA 132* 133*  K 3.8 3.8  CL 100 97*  CO2 24 23  GLUCOSE 140* 110*  BUN 13 12  CREATININE 0.94 0.92  CALCIUM 9.2 8.8*  MG 1.6* 1.7   GFR: Estimated Creatinine Clearance: 99.6 mL/min (by C-G formula based on SCr of 0.92 mg/dL). Liver Function Tests: Recent Labs  Lab 06/27/18 1503 07/02/18 0746  AST 16 24  ALT 13 18  ALKPHOS 104 85  BILITOT 0.5 1.0  PROT 6.8 6.3*  ALBUMIN 3.6 3.6   Recent Labs  Lab 07/02/18 0746  LIPASE 24   No results for input(s): AMMONIA in the last 168 hours. Coagulation Profile: No results for input(s): INR, PROTIME in the last 168 hours. Cardiac Enzymes: Recent Labs  Lab 07/02/18 0746  CKTOTAL 299  TROPONINI <0.03   BNP (last 3 results) No results for input(s): PROBNP in the last 8760 hours. HbA1C: No results for input(s): HGBA1C in the last 72 hours. CBG: No results for input(s): GLUCAP in the last 168 hours. Lipid Profile: No results for input(s): CHOL, HDL, LDLCALC, TRIG, CHOLHDL, LDLDIRECT in the last 72 hours. Thyroid Function Tests: No results for input(s): TSH, T4TOTAL, FREET4, T3FREE, THYROIDAB in the last 72 hours. Anemia Panel: No results for input(s): VITAMINB12, FOLATE, FERRITIN, TIBC, IRON, RETICCTPCT in the last 72 hours. Sepsis Labs: Recent Labs  Lab 07/02/18 0746  LATICACIDVEN 1.2    Recent Results (from the past 240 hour(s))  SARS Coronavirus 2 (CEPHEID - Performed in [facility] hospital lab), Hosp Order     Status: None   Collection Time: 07/02/18  9:16 AM   Specimen: Nasopharyngeal Swab  Result Value Ref Range Status   SARS Coronavirus 2 NEGATIVE NEGATIVE Final    Comment: (NOTE) If result is NEGATIVE SARS-CoV-2 target nucleic acids are NOT DETECTED. The SARS-CoV-2 RNA is generally detectable in upper and lower  respiratory specimens during the acute phase of infection. The lowest  concentration of SARS-CoV-2 viral copies this assay can detect is 250    copies / mL. A negative result does not preclude SARS-CoV-2 infection  and should not be used as the sole basis for treatment or other  patient management decisions.  A negative result may occur with  improper specimen collection / handling, submission of specimen other  than nasopharyngeal swab, presence of viral mutation(s) within the  areas targeted by this assay, and inadequate number of viral copies  (<250 copies / mL). A negative result must be combined with clinical  observations, patient history, and epidemiological information. If result is POSITIVE SARS-CoV-2 target nucleic acids are DETECTED. The SARS-CoV-2 RNA is generally detectable in upper and lower  respiratory specimens dur ing the acute phase of infection.  Positive  results are indicative of active infection with SARS-CoV-2.  Clinical  correlation with patient history and other diagnostic information is  necessary to determine patient infection status.  Positive results do  not rule out bacterial infection or co-infection with other viruses. If result is PRESUMPTIVE POSTIVE SARS-CoV-2 nucleic acids MAY BE PRESENT.   A presumptive positive result was obtained on the submitted specimen  and confirmed on repeat testing.  While 2019 novel coronavirus  (SARS-CoV-2) nucleic acids may be present in the submitted sample  additional confirmatory testing may be necessary for epidemiological  and / or clinical management purposes  to differentiate between  SARS-CoV-2 and other Sarbecovirus currently known to infect humans.  If clinically indicated additional testing with an alternate test  methodology [phone]) is advised. The SARS-CoV-2 RNA is generally  detectable in upper and lower respiratory sp ecimens during the acute  phase of infection. The expected result is Negative. Fact Sheet for Patients:  StrictlyIdeas.no Fact Sheet for Healthcare  Providers: BankingDealers.co.za This test is not yet approved or cleared by the [place] FDA and has been authorized for detection and/or diagnosis of SARS-CoV-2 by FDA under an Emergency Use Authorization (EUA).  This EUA will remain in effect (meaning this test can be used) for the duration of the COVID-19 declaration under Section 564(b)(1) of the Act, 21 U.S.C. section 360bbb-3(b)(1), unless the authorization is terminated or revoked sooner. Performed at [facility], [place] [address] [place], [place] [address]          Radiology Studies: Dg Chest 2 View  Result Date: 07/02/2018 CLINICAL DATA:  Weakness, history stage IV lung cancer EXAM: CHEST - 2 VIEW COMPARISON:  Portable exam 0808 hours compared to CT chest 05/11/2018 FINDINGS: Normal heart size, mediastinal contours, and pulmonary vascularity. Mild chronic RIGHT basilar atelectasis. RIGHT suprahilar nodular density unchanged corresponding to known tumor. Lungs otherwise clear. No infiltrate, pleural effusion or pneumothorax. IMPRESSION: Known RIGHT upper lobe tumor and chronic RIGHT basilar atelectasis. No acute abnormalities. Electronically Signed   By: Lavonia Dana M.D.   On: 07/02/2018 08:15   Ct Head Wo Contrast  Result Date: 07/02/2018 CLINICAL DATA:  Patient with known metastatic lung carcinoma with increased confusion and altered mental status EXAM: CT HEAD WITHOUT CONTRAST TECHNIQUE: Contiguous axial images were obtained from the base of the skull through the vertex without intravenous contrast. COMPARISON:  Brain MRI April 27, 2018 FINDINGS: Brain: Diffuse enlargement of the ventricles is a stable finding. The sulci appear unremarkable. There is extensive vasogenic edema throughout much of the right frontal lobe which was present previously but appears slightly more extensive. No other vasogenic edema is evident. The known multiple masses throughout the brain documented on prior MR  are not delineated focally on this noncontrast enhanced study. There is no hemorrhage. There Is impression on the frontal horn the right lateral ventricle due to the edema, more extensive than on the previous study. No midline shift evident, however. No extra-axial fluid collections are appreciable. Vascular: No appreciable hyperdense vessel. There is mild calcification in each carotid siphon region. Skull: The bony calvarium appears intact. Sinuses/Orbits: There is mucosal thickening in several ethmoid air cells. Other visualized paranasal sinuses are clear. Orbits appear symmetric bilaterally. Other: Visualized mastoid air cells are clear. IMPRESSION: 1. There is extensive vasogenic edema throughout the right frontal lobe which shows a slight increase compared to prior MR from April 2020. There is more mass effect on the frontal horn the right lateral ventricle compared to the previous study. The known mass in this area is not well delineated on this noncontrast enhanced study. 2. Diffuse ventricular enlargement remains. Suspect a degree of underlying normal pressure hydrocephalus. 3. Recent MR showed multiple masses throughout the brain which are not delineated on this noncontrast enhanced study. If further evaluation of these masses and direct comparison of these masses compared to the previous study is  felt to be warranted, would advise MR pre and post-contrast to further evaluate. 4. No acute infarct is demonstrable. No hemorrhage. No midline shift. 5.  Mild mucosal thickening noted in several ethmoid air cells. Electronically Signed   By: Lowella Grip III M.D.   On: 07/02/2018 08:57   Mr Jeri Cos And Wo Contrast  Result Date: 07/02/2018 CLINICAL DATA:  Metastatic disease with progression on CT. EXAM: MRI HEAD WITHOUT AND WITH CONTRAST TECHNIQUE: Multiplanar, multiecho pulse sequences of the brain and surrounding structures were obtained without and with intravenous contrast. CONTRAST:  10 mL Gadovist  IV COMPARISON:  CT head 07/02/2018, MRI head 04/27/2018 FINDINGS: Brain: Image quality degraded by significant motion. Marked ventricular enlargement similar to the prior MRI. Marked dilatation of the lateral third and fourth ventricles suggesting communicating hydrocephalus. This could be due to leptomeningeal tumor although I do not see leptomeningeal enhancement. Large enhancing mass right frontal lobe has enlarged now measuring 3.9 x 4.2 cm. Progression of surrounding edema and mass-effect on the right frontal horn. Numerous enhancing lesions are seen on the prior MRI which are not well identified on today's study due to extensive motion. Right temporal lesion may have enlarged in the interval. Negative for hemorrhage. No acute infarct. Vascular: Normal arterial flow voids Skull and upper cervical spine: Negative Sinuses/Orbits: Negative Other: None IMPRESSION: Image quality degraded by significant motion. This significantly limits evaluation of known metastatic disease to brain. Large mass in the right frontal lobe shows significant interval enlargement since the prior MRI of 04/27/2018, now measuring 3.9 x 4.2 cm. Increased edema and mass-effect. Multiple additional enhancing lesions on the prior MRI are not well seen on today's study likely due to motion. Severe communicating hydrocephalus unchanged. While this could be due to leptomeningeal carcinomatosis and obstruction of CSF resorption, no MRI findings of leptomeningeal carcinomatosis are identified. Electronically Signed   By: Franchot Gallo M.D.   On: 07/02/2018 15:29   US Scrotum  Result Date: 07/02/2018 CLINICAL DATA:  63 year old male with history of left-sided testicular swelling. EXAM: SCROTAL ULTRASOUND DOPPLER ULTRASOUND OF THE TESTICLES TECHNIQUE: Complete ultrasound examination of the testicles, epididymis, and other scrotal structures was performed. Color and spectral Doppler ultrasound were also utilized to evaluate blood flow to the  testicles. COMPARISON:  No priors. FINDINGS: Right testicle Measurements: 4.2 x 2.5 x 3.1 cm. No mass or microlithiasis visualized. Left testicle Measurements: 4.2 x 3.3 x 3.5 cm. No mass or microlithiasis visualized. Right epididymis:  Normal in size and appearance. Left epididymis:  Normal in size and appearance. Hydrocele: Left-sided hydrocele with low-level internal echogenicity. Varicocele:  None visualized. Pulsed Doppler interrogation of both testes demonstrates normal low resistance arterial and venous waveforms bilaterally. IMPRESSION: 1. Moderate left-sided hydrocele. 2. Normal sonographic appearance of the testicles and epididymides bilaterally. Electronically Signed   By: Vinnie Langton M.D.   On: 07/02/2018 19:21        Scheduled Meds:  dexamethasone  4 mg Intravenous Q6H   pantoprazole  40 mg Oral Daily   Continuous Infusions:   LOS: 1 day   Time spent=35 mins    [place] Alfredo Arsenio Loader, MD [facility]  If 7PM-7AM, please contact night-coverage [email or web address] 07/03/2018, 11:05 AM

## 2018-07-03 NOTE — Progress Notes (Addendum)
HEMATOLOGY-ONCOLOGY PROGRESS NOTE  SUBJECTIVE: Dwayne Massey was admitted from home secondary to confusion, generalized weakness, and unsteady gait.  MRI of the brain was performed which showed a large mass in the right frontal lobe with significant interval enlargement since the prior MRI in April 2020. The patient was started on dexamethasone 4 mg every 6 hours.  He received SRS to his brain lesion in April/May 2020. When seen today, the patient reports that he still has generalized weakness.  He is not sure if his confusion is much better.  Denies headaches and dizziness.  Denies seizures.  Denies chest discomfort, shortness of breath, cough, hemoptysis.  Denies nausea and vomiting.  He is afebrile and other vital signs are stable.  He has no other complaints this morning.  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 ([place])  12/25/2017 Initial Diagnosis   Neuroendocrine carcinoma metastatic to lung ([place])   05/21/2018 -  Chemotherapy   The patient had palonosetron (ALOXI) injection 0.25 mg, 0.25 mg, Intravenous,  Once, 2 of 6 cycles Administration: 0.25 mg (05/21/2018), 0.25 mg (05/29/2018), 0.25 mg (06/19/2018) irinotecan (CAMPTOSAR) 160 mg in dextrose 5 % 500 mL chemo infusion, 65 mg/m2 = 160 mg, Intravenous,  Once, 2 of 6 cycles Dose modification: 50 mg/m2 (original dose 65 mg/m2, Cycle 5, Reason: Dose not tolerated) Administration: 160 mg (05/21/2018), 160 mg (05/29/2018), 120 mg (06/19/2018) CISplatin (PLATINOL) 74 mg in sodium chloride 0.9 % 250 mL chemo infusion, 30 mg/m2 = 74 mg, Intravenous,  Once, 2 of 6 cycles Dose modification: 25 mg/m2 (original dose 30 mg/m2, Cycle 5, Reason: Dose not tolerated) Administration: 74 mg (05/21/2018), 74 mg (05/29/2018), 62 mg (06/19/2018) fosaprepitant (EMEND) 150 mg, dexamethasone (DECADRON) 12 mg in sodium chloride 0.9 % 145 mL IVPB, , Intravenous,  Once, 2 of 6 cycles Administration:  (05/21/2018),  (05/29/2018),  (06/19/2018)  for chemotherapy  treatment.       REVIEW OF SYSTEMS:   Constitutional: Denies fevers, chills.  Has generalized weakness. Eyes: Denies blurriness of vision Ears, nose, mouth, throat, and face: Denies mucositis or sore throat Respiratory: Denies cough, dyspnea or wheezes Cardiovascular: Denies palpitation, chest discomfort Gastrointestinal:  Denies nausea, heartburn or change in bowel habits Skin: Denies abnormal skin rashes Lymphatics: Denies new lymphadenopathy or easy bruising Neurological:Denies numbness, tingling or new weaknesses Behavioral/Psych: Mood is stable, no new changes  Extremities: No lower extremity edema All other systems were reviewed with the patient and are negative.  I have reviewed the past medical history, past surgical history, social history and family history with the patient and they are unchanged from previous note.   PHYSICAL EXAMINATION: ECOG PERFORMANCE STATUS: 2 - Symptomatic, <50% confined to bed  Vitals:   07/02/18 1900 07/03/18 0440  BP: 110/73 107/72  Pulse: 80 72  Resp: 20 18  Temp:  97.9 F (36.6 C)  SpO2: 99% 97%   Filed Weights   07/02/18 0727 07/02/18 1953  Weight: 240 lb 4.8 oz (109 kg) 227 lb 1.2 oz (103 kg)    Intake/Output from previous day: 06/22 0701 - 06/23 0700 In: -  Out: 225 [Urine:225]  GENERAL:alert, no distress and comfortable SKIN: skin color, texture, turgor are normal, no rashes or significant lesions EYES: normal, Conjunctiva are pink and non-injected, sclera clear. EOMI. PERRL. OROPHARYNX:no exudate, no erythema and lips, buccal mucosa, and tongue normal  NECK: supple, thyroid normal size, non-tender, without nodularity LYMPH:  no palpable lymphadenopathy in the cervical, axillary or inguinal LUNGS: clear to auscultation and percussion with  normal breathing effort HEART: regular rate & rhythm and no murmurs and no lower extremity edema ABDOMEN:abdomen soft, non-tender and normal bowel sounds Musculoskeletal:no cyanosis of  digits and no clubbing  NEURO: alert & oriented x 3 with fluent speech, no focal motor/sensory deficits  LABORATORY DATA:  I have reviewed the data as listed CMP Latest Ref Rng & Units 07/02/2018 06/27/2018 06/19/2018  Glucose 70 - 99 mg/dL 110(H) 140(H) 100(H)  BUN 8 - 23 mg/dL 12 13 9   Creatinine 0.61 - 1.24 mg/dL 0.92 0.94 0.85  Sodium 135 - 145 mmol/L 133(L) 132(L) 136  Potassium 3.5 - 5.1 mmol/L 3.8 3.8 4.1  Chloride 98 - 111 mmol/L 97(L) 100 103  CO2 22 - 32 mmol/L 23 24 24   Calcium 8.9 - 10.3 mg/dL 8.8(L) 9.2 8.7(L)  Total Protein 6.5 - 8.1 g/dL 6.3(L) 6.8 6.3(L)  Total Bilirubin 0.3 - 1.2 mg/dL 1.0 0.5 0.3  Alkaline Phos 38 - 126 U/L 85 104 100  AST 15 - 41 U/L 24 16 16   ALT 0 - 44 U/L 18 13 11     Lab Results  Component Value Date   WBC 5.9 07/02/2018   HGB 12.9 (L) 07/02/2018   HCT 39.1 07/02/2018   MCV 91.6 07/02/2018   PLT 172 07/02/2018   NEUTROABS 5.1 07/02/2018    Dg Chest 2 View  Result Date: 07/02/2018 CLINICAL DATA:  Weakness, history stage IV lung cancer EXAM: CHEST - 2 VIEW COMPARISON:  Portable exam 0808 hours compared to CT chest 05/11/2018 FINDINGS: Normal heart size, mediastinal contours, and pulmonary vascularity. Mild chronic RIGHT basilar atelectasis. RIGHT suprahilar nodular density unchanged corresponding to known tumor. Lungs otherwise clear. No infiltrate, pleural effusion or pneumothorax. IMPRESSION: Known RIGHT upper lobe tumor and chronic RIGHT basilar atelectasis. No acute abnormalities. Electronically Signed   By: Lavonia Dana M.D.   On: 07/02/2018 08:15   Ct Head Wo Contrast  Result Date: 07/02/2018 CLINICAL DATA:  Patient with known metastatic lung carcinoma with increased confusion and altered mental status EXAM: CT HEAD WITHOUT CONTRAST TECHNIQUE: Contiguous axial images were obtained from the base of the skull through the vertex without intravenous contrast. COMPARISON:  Brain MRI April 27, 2018 FINDINGS: Brain: Diffuse enlargement of the  ventricles is a stable finding. The sulci appear unremarkable. There is extensive vasogenic edema throughout much of the right frontal lobe which was present previously but appears slightly more extensive. No other vasogenic edema is evident. The known multiple masses throughout the brain documented on prior MR are not delineated focally on this noncontrast enhanced study. There is no hemorrhage. There Is impression on the frontal horn the right lateral ventricle due to the edema, more extensive than on the previous study. No midline shift evident, however. No extra-axial fluid collections are appreciable. Vascular: No appreciable hyperdense vessel. There is mild calcification in each carotid siphon region. Skull: The bony calvarium appears intact. Sinuses/Orbits: There is mucosal thickening in several ethmoid air cells. Other visualized paranasal sinuses are clear. Orbits appear symmetric bilaterally. Other: Visualized mastoid air cells are clear. IMPRESSION: 1. There is extensive vasogenic edema throughout the right frontal lobe which shows a slight increase compared to prior MR from April 2020. There is more mass effect on the frontal horn the right lateral ventricle compared to the previous study. The known mass in this area is not well delineated on this noncontrast enhanced study. 2. Diffuse ventricular enlargement remains. Suspect a degree of underlying normal pressure hydrocephalus. 3. Recent MR showed multiple masses  throughout the brain which are not delineated on this noncontrast enhanced study. If further evaluation of these masses and direct comparison of these masses compared to the previous study is felt to be warranted, would advise MR pre and post-contrast to further evaluate. 4. No acute infarct is demonstrable. No hemorrhage. No midline shift. 5.  Mild mucosal thickening noted in several ethmoid air cells. Electronically Signed   By: Lowella Grip III M.D.   On: 07/02/2018 08:57   Mr Jeri Cos  And Wo Contrast  Result Date: 07/02/2018 CLINICAL DATA:  Metastatic disease with progression on CT. EXAM: MRI HEAD WITHOUT AND WITH CONTRAST TECHNIQUE: Multiplanar, multiecho pulse sequences of the brain and surrounding structures were obtained without and with intravenous contrast. CONTRAST:  10 mL Gadovist IV COMPARISON:  CT head 07/02/2018, MRI head 04/27/2018 FINDINGS: Brain: Image quality degraded by significant motion. Marked ventricular enlargement similar to the prior MRI. Marked dilatation of the lateral third and fourth ventricles suggesting communicating hydrocephalus. This could be due to leptomeningeal tumor although I do not see leptomeningeal enhancement. Large enhancing mass right frontal lobe has enlarged now measuring 3.9 x 4.2 cm. Progression of surrounding edema and mass-effect on the right frontal horn. Numerous enhancing lesions are seen on the prior MRI which are not well identified on today's study due to extensive motion. Right temporal lesion may have enlarged in the interval. Negative for hemorrhage. No acute infarct. Vascular: Normal arterial flow voids Skull and upper cervical spine: Negative Sinuses/Orbits: Negative Other: None IMPRESSION: Image quality degraded by significant motion. This significantly limits evaluation of known metastatic disease to brain. Large mass in the right frontal lobe shows significant interval enlargement since the prior MRI of 04/27/2018, now measuring 3.9 x 4.2 cm. Increased edema and mass-effect. Multiple additional enhancing lesions on the prior MRI are not well seen on today's study likely due to motion. Severe communicating hydrocephalus unchanged. While this could be due to leptomeningeal carcinomatosis and obstruction of CSF resorption, no MRI findings of leptomeningeal carcinomatosis are identified. Electronically Signed   By: Franchot Gallo M.D.   On: 07/02/2018 15:29   US Scrotum  Result Date: 07/02/2018 CLINICAL DATA:  63 year old male with  history of left-sided testicular swelling. EXAM: SCROTAL ULTRASOUND DOPPLER ULTRASOUND OF THE TESTICLES TECHNIQUE: Complete ultrasound examination of the testicles, epididymis, and other scrotal structures was performed. Color and spectral Doppler ultrasound were also utilized to evaluate blood flow to the testicles. COMPARISON:  No priors. FINDINGS: Right testicle Measurements: 4.2 x 2.5 x 3.1 cm. No mass or microlithiasis visualized. Left testicle Measurements: 4.2 x 3.3 x 3.5 cm. No mass or microlithiasis visualized. Right epididymis:  Normal in size and appearance. Left epididymis:  Normal in size and appearance. Hydrocele: Left-sided hydrocele with low-level internal echogenicity. Varicocele:  None visualized. Pulsed Doppler interrogation of both testes demonstrates normal low resistance arterial and venous waveforms bilaterally. IMPRESSION: 1. Moderate left-sided hydrocele. 2. Normal sonographic appearance of the testicles and epididymides bilaterally. Electronically Signed   By: Vinnie Langton M.D.   On: 07/02/2018 19:21    ASSESSMENT AND PLAN: 1.  Metastatic neuroendocrine carcinoma -currently receiving cisplatin 25 mg meter squared and Irinotecan 50 mg/m on day 1 and day 8 of every cycle.  Last dose of chemotherapy was given on 06/19/2018. 2.  Enlarging frontal brain metastasis 3.  Altered mental status secondary to #2  -Continue dexamethasone 4 mg every 6 hours. -Consult radiation oncology.  I have alerted their office about the patient's admission. -Recommend PT/OT evaluation  due to the patient's weakness. -Keep outpatient follow-up with medical oncology on 07/10/2018 for evaluation prior to his next cycle of chemotherapy.  He is supposed to have a restaging CT scan of the chest, abdomen, pelvis prior to his next visit but this is not yet scheduled.  We will work on arranging this for him.   LOS: 1 day   Dwayne Bussing, DNP, AGPCNP-BC, AOCNP 07/03/18   ADDENDUM: The patient is due for  outpatient restaging CT scans of the chest, abdomen, pelvis.  We will go ahead and order these as an inpatient.  If there is disease progression, may need to palliative care involved for further discussions of goals of care.  Dwayne Bussing, DNP, AGPCNP-BC, AOCNP

## 2018-07-03 NOTE — Evaluation (Signed)
Physical Therapy Evaluation Patient Details Name: Dwayne Massey MRN: [id number] DOB: 1955/04/23 Today's Date: 07/03/2018   History of Present Illness  Pt is a 63 year old male admitted from home secondary to confusion, generalized weakness, and unsteady gait.  MRI of the brain was performed which showed a large mass in the right frontal lobe with significant interval enlargement since the prior MRI in April 2020.  Medical history significant of metastatic neuroendocrine carcinoma with metastasis to lymph nodes, brain, adrenal gland  Clinical Impression  Pt admitted with above diagnosis. Pt currently with functional limitations due to the deficits listed below (see PT Problem List).  Pt will benefit from skilled PT to increase their independence and safety with mobility to allow discharge to the venue listed below.  Pt requiring increased assist to mobilize.  Pt assisted to standing x2 and had some dizziness which resolved.  Due to weakness, LEs buckling, and posterior lean with standing, deferred attempting to ambulate at this time.   Pt will need physical assist upon d/c and states his spouse is unable to assist.  Recommend SNF at this time.    Follow Up Recommendations SNF    Equipment Recommendations  None recommended by PT    Recommendations for Other Services       Precautions / Restrictions Precautions Precautions: Fall Restrictions Weight Bearing Restrictions: No      Mobility  Bed Mobility Overal bed mobility: Needs Assistance Bed Mobility: Supine to Sit;Sit to Supine     Supine to sit: Mod assist Sit to supine: Max assist   General bed mobility comments: multimodal cues for technique, assist for trunk control, assist for LEs onto bed; upon initial sitting EOB pt reported dizziness and returned to supine - BP 128/81 mmHg, 75 bpm; pt assisted to sitting again and BP 113/81 mmHg, 97 bpm  Transfers Overall transfer level: Needs assistance Equipment used: Rolling walker (2  wheeled) Transfers: Sit to/from Stand Sit to Stand: Mod assist;+2 safety/equipment;+2 physical assistance         General transfer comment: verbal cues for safe technique, performed once and pt requested return to sitting due to off balance, posterior lean present, assisted again to standing and pt able to take side steps up [facility] however not coordinated and requiring at least mod assist  Ambulation/Gait                Stairs            Wheelchair Mobility    Modified Rankin (Stroke Patients Only)       Balance Overall balance assessment: History of Falls;Needs assistance         Standing balance support: Bilateral upper extremity supported Standing balance-Leahy Scale: Poor Standing balance comment: requiring UE support and external assist                             Pertinent Vitals/Pain Pain Assessment: No/denies pain    Home Living Family/patient expects to be discharged to:: Private residence Living Arrangements: Spouse/significant other   Type of Home: Apartment Home Access: Stairs to enter   [employer] of Steps: 1 Home Layout: One level Home Equipment: Walker - 2 wheels Additional Comments: information per pt reports, uncertain of accuracy, per chart from home with spouse    Prior Function Level of Independence: Independent with assistive device(s)               Hand Dominance  Extremity/Trunk Assessment        Lower Extremity Assessment Lower Extremity Assessment: Generalized weakness    Cervical / Trunk Assessment Cervical / Trunk Assessment: Normal  Communication   Communication: No difficulties  Cognition Arousal/Alertness: Awake/alert Behavior During Therapy: WFL for tasks assessed/performed Overall Cognitive Status: No family/caregiver present to determine baseline cognitive functioning                                 General Comments: cues for safety required, cooperative  and responds appropriately to questions however some statments no situational, pt also reaching to touch/handle objects      General Comments      Exercises     Assessment/Plan    PT Assessment Patient needs continued PT services  PT Problem List Decreased strength;Decreased mobility;Decreased activity tolerance;Decreased balance;Decreased knowledge of use of DME;Decreased safety awareness;Decreased coordination       PT Treatment Interventions DME instruction;Therapeutic activities;Gait training;Therapeutic exercise;Patient/family education;Functional mobility training;Stair training;Balance training;Wheelchair mobility training    PT Goals (Current goals can be found in the Care Plan section)  Acute Rehab PT Goals PT Goal Formulation: With patient Time For Goal Achievement: 07/17/18 Potential to Achieve Goals: Good    Frequency Min 2X/week   Barriers to discharge        Co-evaluation               AM-PAC PT "6 Clicks" Mobility  Outcome Measure Help needed turning from your back to your side while in a flat bed without using bedrails?: A Lot Help needed moving from lying on your back to sitting on the side of a flat bed without using bedrails?: A Lot Help needed moving to and from a bed to a chair (including a wheelchair)?: A Lot Help needed standing up from a chair using your arms (e.g., wheelchair or bedside chair)?: A Lot Help needed to walk in hospital room?: Total Help needed climbing 3-5 steps with a railing? : Total 6 Click Score: 10    End of Session Equipment Utilized During Treatment: Gait belt Activity Tolerance: Patient tolerated treatment well Patient left: in bed;with bed alarm set;with call bell/phone within reach   PT Visit Diagnosis: Other abnormalities of gait and mobility (R26.89);Unsteadiness on feet (R26.81)    Time: 7124-5809 PT Time Calculation (min) (ACUTE ONLY): 18 min   Charges:   PT Evaluation $PT Eval Low Complexity: [place], PT, DPT Acute Rehabilitation Services Office: [phone] Pager: [phone]  Trena Platt 07/03/2018, 12:52 PM

## 2018-07-03 NOTE — Progress Notes (Signed)
Radiation oncology was contacted today regarding inpatient consult on this patient who is well-known to our service.  He was recently admitted on 07/02/2018 with altered mental status, confusion and weakness progressively worsening over the past week.  He has known metastatic neuroendocrine carcinoma with disease in the lung, mediastinal lymph nodes, bone, brain and adrenal glands, initially diagnosed in 2016 while living in [place] and treated with concurrent chemoradiation.  He has been on multiple courses of systemic chemotherapy since that time, unfortunately with continued disease progression despite.   He has also had a prior course of SRS to 3 sites of metastatic disease in the brain-- right frontal, left parietal, and right temporal-- completed 03/2017 in [place].  A restaging MRI of the brain on 02/15/2018 demonstrated subcentimeter areas of enhancement in the right frontal and temporal lobes that were felt to favor metastatic disease.  This was further evaluated with a 3T MRI on 04/27/2018 which showed disease progression in the brain with 4 new lesions-- 6 mm left parietal, 9 mm and a 2 mm left temporal and a 2 mm right cerebellar lesion- which were treated with SRS on 05/09/2018 and 05/11/2018 (patient did not tolerate the single fraction which was originally planned due to claustrophobia in the treatment mask) under the care and direction of Dr. Eppie Gibson.  The patient tolerated treatment well and has been getting along fairly well, continuing systemic chemotherapy until the past 1 to 2 weeks when he began to develop progressive weakness and confusion.  A recent MRI of the brain obtained on 07/02/2018 showed significant interval enlargement of the previously treated right frontal lobe lesion now measuring 3.9 x 4.2 cm as compared to 1.8 cm on previous MRI in April 2020 and now with increased edema and mass-effect.  This lesion was previously treated with SRS in 03/29/2017.  The patient was started on 4 mg  dexamethasone every 6 hours and has noted significant improvement in his mental clarity and feels he has had some mild improvement in the generalized weakness.  I called to inform the patient that unfortunately, Dr. Isidore Moos is on vacation this week, but one of her partners, Dr. Tyler Pita, has reviewed his recent MRI and his case in her absence.  We are planning to present his case at the multidisciplinary brain tumor board tomorrow morning 07/04/2018 to formalize treatment options/recommendations.  Unfortunately, SRS is not a treatment option for this lesion as it has already previously received high-dose radiation in March 2019 but consideration may be given for surgical resection, conventional/palliative radiation or possibly LiTT.  I advised that I will be back in touch with him tomorrow to discuss the recommendations from the multidisciplinary tumor board and will conduct a more formal consult visit at that time.  He is comfortable with and in agreement with the stated plan.  I then hung up the phone and called his wife, [name], to share the same information to keep her in the loop and informed.  She was quite appreciative and in agreement with the stated plan as well.  He should remain on steroids for symptom management for the time being.  He is also due for repeat systemic imaging which was originally scheduled for 07/03/2018, prior to this hospital admission.  Mikey Bussing, NP is working to coordinate this, possibly while he is in the hospital, to save them an extra trip.  At present, it appears that he will be placed in a skilled nursing facility for rehab once he is stable for  discharge.  Formal radiation oncology consult note to follow on 07/04/2018.  Nicholos Johns, MMS, PA-C [facility] [place]: [phone]  Fax: [phone]

## 2018-07-03 NOTE — Consult Note (Signed)
Consultation Note Date: 07/03/2018   Patient Name: Dwayne Massey  DOB: 11-03-1955  MRN: [id number]  Age / Sex: 63 y.o., male  PCP: Patient, No Pcp Per Referring Physician: Damita Lack, MD  Reason for Consultation: Establishing goals of care  HPI/Patient Profile: 63 y.o. male  admitted on 07/02/2018   Clinical Assessment and Goals of Care: 63 year old gentleman with life limiting illness of metastatic neuroendocrine carcinoma currently receiving treatment with chemotherapy.  Patient admitted with altered mental status and weakness found to have enlarging frontal brain metastasis.  Patient has been placed on dexamethasone, has been seen and evaluated by medical oncology, to be seen by radiation soon.  He did receive SRS to his brain lesion in April/May 2020.  Patient admitted with confusion, weakness and dizziness.  Palliative consult for goals of care discussions.  I met with the patient and discussed with him at bedside.  Patient has good insight into his serious illness and is also aware of next steps/plan.  He has worked some with physical therapy today.  Goals wishes and values important to the patient attempted to be elicited.  Please note additional discussions as noted below.  Thank you for the consult.  NEXT OF KIN Lives at home with his wife.  Originally from [place], [place].  Moved to [place] recently to be near his friends.  SUMMARY OF RECOMMENDATIONS   Full code, full scope.  Agree with dexamethasone.  Medical oncology input reviewed.  Patient is aware of plan: Pending consultation with radiation oncology regarding enlarging frontal brain metastatic disease.  Patient aware that he has to follow-up outpatient with medical oncology, possible restaging CTs to be done soon.  Physical therapy has recommended skilled nursing facility for rehabilitation towards the end of this  hospitalization due to the patient's weakness.  Agree.  Recommend palliative follow-up over there if can be arranged.  Code Status/Advance Care Planning:  Full code    Symptom Management:    as above   Palliative Prophylaxis:   Delirium Protocol  Additional Recommendations (Limitations, Scope, Preferences):  Full Scope Treatment  Psycho-social/Spiritual:   Desire for further Chaplaincy support:yes  Additional Recommendations: Caregiving  Support/Resources  Prognosis:   Unable to determine  Discharge Planning: [facility] for rehab with Palliative care service follow-up      Primary Diagnoses: Present on Admission: . AMS (altered mental status) . Malignant neoplasm of unspecified part of unspecified bronchus or lung ([place])   I have reviewed the medical record, interviewed the patient and family, and examined the patient. The following aspects are pertinent.  Past Medical History:  Diagnosis Date  . 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 Chemotherapy induced nausea and vomiting   . Chemotherapy induced neutropenia ([place])   . Herpes zoster   . Meningitis   . Metastatic cancer to brain [facility])    Social History   Socioeconomic History  . Marital status: Significant Other    Spouse name: Not on file  . Number of children: Not on file  .  Years of education: Not on file  . Highest education level: Not on file  Occupational History  . Not on file  Social Needs  . Financial resource strain: Not on file  . Food insecurity    Worry: Not on file    Inability: Not on file  . Transportation needs    Medical: No    Non-medical: No  Tobacco Use  . Smoking status: Never Smoker  . Smokeless tobacco: Never Used  Substance and Sexual Activity  . Alcohol use: Not on file    Comment: rare, but not currently.   . Drug use: Never  . Sexual activity: Not on file  Lifestyle  . Physical activity    Days per week: Not on file    Minutes per session: Not on file  .  Stress: Not on file  Relationships  . Social Herbalist on phone: Not on file    Gets together: Not on file    Attends religious service: Not on file    Active member of club or organization: Not on file    Attends meetings of clubs or organizations: Not on file    Relationship status: Not on file  Other Topics Concern  . Not on file  Social History Narrative  . Not on file   History reviewed. No pertinent family history. Scheduled Meds: . dexamethasone  4 mg Intravenous Q6H  . pantoprazole  40 mg Oral Daily   Continuous Infusions: PRN Meds:.alum & mag hydroxide-simeth, guaiFENesin-dextromethorphan, hydrALAZINE, hydrocortisone, hydrocortisone cream, lip balm, loratadine, Muscle Rub, ondansetron (ZOFRAN) IV, phenol, polyethylene glycol, polyvinyl alcohol, senna-docusate, sodium chloride Medications Prior to Admission:  Prior to Admission medications   Medication Sig Start Date End Date Taking? Authorizing Provider  naproxen sodium (ALEVE) 220 MG tablet Take 440 mg by mouth 2 (two) times daily as needed (pain).   Yes [provider]  prochlorperazine (COMPAZINE) 10 MG tablet Take 1 tablet (10 mg total) by mouth every 6 (six) hours as needed for nausea or vomiting. 05/14/18  Yes Curt Bears, MD  diphenoxylate-atropine (LOMOTIL) 2.5-0.025 MG tablet Take 1 tablet by mouth 4 (four) times daily as needed for diarrhea or loose stools. Patient not taking: Reported on 06/19/2018 06/11/18   Heilingoetter, Cassandra L, PA-C  LORazepam (ATIVAN) 2 MG tablet Take 1 tablets PO about 20-30 minutes before wearing radiosurgery mask to help with anxiety. Do not drive while on this. Patient not taking: Reported on 07/02/2018 05/09/18   Gery Pray, MD   No Known Allergies Review of Systems +weakness  Physical Exam Patient is in good spirits resting in bed.  Does not appear to be in distress. S1-S2 Regular Abdomen is not distended He is thin, has some muscle wasting Awake alert  oriented nonfocal  Vital Signs: BP 118/81 (BP Location: Left Arm)   Pulse 81   Temp 98.1 F (36.7 C) (Oral)   Resp 18   Ht '6\' 4"'  (1.93 m)   Wt 103 kg   SpO2 98%   BMI 27.64 kg/m  Pain Scale: 0-10   Pain Score: 0-No pain   SpO2: SpO2: 98 % O2 Device:SpO2: 98 % O2 Flow Rate: .   IO: Intake/output summary:   Intake/Output Summary (Last 24 hours) at 07/03/2018 1448 Last data filed at 07/03/2018 1400 Gross per 24 hour  Intake 120 ml  Output 375 ml  Net -255 ml    LBM: Last BM Date: 07/02/18 Baseline Weight: Weight: 109 kg Most recent weight: Weight:  103 kg     Palliative Assessment/Data:   Flowsheet Rows     Most Recent Value  Intake Tab  Referral Department  Hospitalist  Unit at Time of Referral  Oncology Unit  Palliative Care Primary Diagnosis  Cancer  Palliative Care Type  New Palliative care  Reason for referral  Clarify Goals of Care  Date first seen by Palliative Care  07/03/18  Clinical Assessment  Palliative Performance Scale Score  50%  Pain Max last 24 hours  4  Pain Min Last 24 hours  3  Dyspnea Max Last 24 Hours  3  Dyspnea Min Last 24 hours  2  Nausea Max Last 24 Hours  3  Nausea Min Last 24 Hours  2  Psychosocial & Spiritual Assessment  Palliative Care Outcomes      Time In:  1400 Time Out:  1500 Time Total:  60 min  Greater than 50%  of this time was spent counseling and coordinating care related to the above assessment and plan.  Signed by: Loistine Chance, MD [id number]  Please contact Palliative Medicine Team phone at [phone] for questions and concerns.  For individual provider: See Shea Evans

## 2018-07-04 ENCOUNTER — Ambulatory Visit
Admit: 2018-07-04 | Discharge: 2018-07-04 | Disposition: A | Discharge status: Home / Self Care | Payer: No Typology Code available for payment source | Attending: Radiation Oncology | Admitting: Radiation Oncology

## 2018-07-04 DIAGNOSIS — Z515 Encounter for palliative care: Secondary | ICD-10-CM

## 2018-07-04 DIAGNOSIS — C7931 Secondary malignant neoplasm of brain: Secondary | ICD-10-CM

## 2018-07-04 DIAGNOSIS — L89001 Pressure ulcer of unspecified elbow, stage 1: Secondary | ICD-10-CM

## 2018-07-04 LAB — CBC
HCT: 42 % (ref 39.0–52.0)
Hemoglobin: 13.6 g/dL (ref 13.0–17.0)
MCH: 29.9 pg (ref 26.0–34.0)
MCHC: 32.4 g/dL (ref 30.0–36.0)
MCV: 92.3 fL (ref 80.0–100.0)
Platelets: 187 10*3/uL (ref 150–400)
RBC: 4.55 MIL/uL (ref 4.22–5.81)
RDW: 17.2 % — ABNORMAL HIGH (ref 11.5–15.5)
WBC: 7.4 10*3/uL (ref 4.0–10.5)
nRBC: 0 % (ref 0.0–0.2)

## 2018-07-04 LAB — BASIC METABOLIC PANEL
Anion gap: 12 (ref 5–15)
BUN: 19 mg/dL (ref 8–23)
CO2: 24 mmol/L (ref 22–32)
Calcium: 9.2 mg/dL (ref 8.9–10.3)
Chloride: 97 mmol/L — ABNORMAL LOW (ref 98–111)
Creatinine, Ser: 0.86 mg/dL (ref 0.61–1.24)
GFR calc Af Amer: >60 mL/min (ref >60)
GFR calc non Af Amer: >60 mL/min (ref >60)
Glucose, Bld: 132 mg/dL — ABNORMAL HIGH (ref 70–99)
Potassium: 4.2 mmol/L (ref 3.5–5.1)
Sodium: 133 mmol/L — ABNORMAL LOW (ref 135–145)

## 2018-07-04 LAB — MAGNESIUM: Magnesium: 2.2 mg/dL (ref 1.7–2.4)

## 2018-07-04 LAB — HIV ANTIBODY (ROUTINE TESTING W REFLEX): HIV Screen 4th Generation wRfx: NONREACTIVE

## 2018-07-04 MED ORDER — SENNOSIDES-DOCUSATE SODIUM 8.6-50 MG PO TABS: 2.0000 | ORAL_TABLET | Freq: Every evening | ORAL | Status: DC | PRN

## 2018-07-04 NOTE — Consult Note (Signed)
Radiation Oncology         [phone] ________________________________  Initial inpatient Consultation- conducted via telephone due to current COVID-19 concerns for limiting patient exposure.  Name: Dwayne Massey MRN: [id number]  Date of Service: 07/02/2018 DOB: 06-04-1955  [id number], No Pcp Per  No ref. provider found   REFERRING PHYSICIAN: Dr. 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    ICD-10-CM   1. Metastatic cancer to brain [facility])  C79.31   2. Testicular swelling, left  N50.89 US SCROTUM    US SCROTUM    HISTORY OF PRESENT ILLNESS: Dwayne Massey is a 63 y.o. male seen at the request of Dr. Julien Nordmann.  This visit was conducted via telephone to spare the patient unnecessary potential exposure in the healthcare setting during the current COVID-19 pandemic.  He is well know to our service, previously treated with Dr. 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  He was recently admitted on 07/02/2018 with altered mental status, confusion and weakness progressively worsening over the past week.  He has known metastatic neuroendocrine carcinoma with disease in the lung, mediastinal lymph nodes, bone, brain and adrenal glands, initially diagnosed in 2016 while living in [place] and treated with concurrent chemoradiation.  He has been on multiple courses of systemic chemotherapy since that time, unfortunately with continued disease progression despite.   He has also had a prior course of SRS to 3 sites of metastatic disease in the brain-- right frontal, left parietal, and right temporal-- completed 03/2017 in [place].  More recently, a restaging MRI of the brain on 02/15/2018 demonstrated subcentimeter areas of enhancement in the right frontal and temporal lobes that were felt to favor metastatic disease.  This was further evaluated with a 3T MRI on 04/27/2018 which showed disease  progression in the brain with 4 new lesions-- 6 mm left parietal, 9 mm and a 2 mm left temporal and a 2 mm right cerebellar lesion- which were treated with SRS on 05/09/2018 and 05/11/2018 (patient did not tolerate the single fraction which was originally planned due to claustrophobia in the treatment mask) under the care and direction of Dr. Eppie Gibson. The enhancement in the right frontal lesion was felt most likely treatment effect and was not re-treated.   The patient tolerated treatment well and had been getting along fairly well, continuing systemic chemotherapy until the past 1 to 2 weeks when he began to develop progressive weakness and confusion.  A recent MRI of the brain obtained on 07/02/2018 showed significant interval enlargement of the previously treated right frontal lobe lesion now measuring 3.9 x 4.2 cm as compared to 1.8 cm on previous MRI in April 2020 and now with increased edema and mass-effect.  This lesion was previously treated with SRS in 03/29/2017.  The patient was started on 4 mg dexamethasone every 6 hours and has noted significant improvement in his mental clarity and feels he has had some mild improvement in the generalized weakness.    In light of these new findings on recent MRI brain, radiation oncology was asked to consult for further evaluation and recommendations for treatment.  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 Mickeal Skinner at discharge, for further discussion of treatment options.    PREVIOUS RADIATION THERAPY: Yes:   05/09/2018 and 05/11/2018 (patient received treatment over two sessions as he could not tolerate SRS in one session due to issues with wearing the mask, tolerating his  setup)  04/06/2017: Brain (3 targets), SRS / 20 Gy to each site in 1 fraction (Dr. Lucretia Field)  11/19/2014 - 01/06/2015: Oropharynx/Neck, IMRT / 66 Gy in 33 fractions (Dr. Lucretia Field)  PAST MEDICAL HISTORY:  Past Medical History:  Diagnosis Date   Cancer of tonsil ([place])    Chemotherapy induced nausea and vomiting    Chemotherapy induced neutropenia ([place])    Herpes zoster    Meningitis    Metastatic cancer to brain [facility])       PAST SURGICAL HISTORY: Past Surgical History:  Procedure Laterality Date   KNEE SURGERY     LUNG SURGERY Right    for neuroendocrine tumor in [place]: History reviewed. No pertinent family history.  SOCIAL HISTORY:  Social History   Socioeconomic History   Marital status: Significant Other    Spouse name: Not on file   Number of children: Not on file   Years of education: Not on file   Highest education level: Not on file  Occupational History   Not on file  Social Needs   Financial resource strain: Not on file   Food insecurity    Worry: Not on file    Inability: Not on file   Transportation needs    Medical: No    Non-medical: No  Tobacco Use   Smoking status: Never Smoker   Smokeless tobacco: Never Used  Substance and Sexual Activity   Alcohol use: Not on file    Comment: rare, but not currently.    Drug use: Never   Sexual activity: Not on file  Lifestyle   Physical activity    Days per week: Not on file    Minutes per session: Not on file   Stress: Not on file  Relationships   Social connections    Talks on phone: Not on file    Gets together: Not on file    Attends religious service: Not on file    Active member of club or organization: Not on file    Attends meetings of clubs or organizations: Not on file    Relationship status: Not on file   Intimate partner violence    Fear of current or ex partner: No    Emotionally abused: No    Physically abused: No     Forced sexual activity: No  Other Topics Concern   Not on file  Social History Narrative   Not on file    ALLERGIES: Patient has no known allergies.  MEDICATIONS:  Current Facility-Administered Medications  Medication Dose Route Frequency Provider Last Rate Last Dose   alum & mag hydroxide-simeth (MAALOX/MYLANTA) 200-200-20 MG/5ML suspension 30 mL  30 mL Oral Q4H PRN Amin, Ankit Chirag, MD       dexamethasone (DECADRON) injection 4 mg  4 mg Intravenous Q6H Adhikari, Amrit, MD   4 mg at 07/04/18 0634   guaiFENesin-dextromethorphan (ROBITUSSIN DM) 100-10 MG/5ML syrup 5 mL  5 mL Oral Q4H PRN Amin, Ankit Chirag, MD       hydrALAZINE (APRESOLINE) injection 10 mg  10 mg Intravenous Q4H PRN Amin, Jeanella Flattery, MD  hydrocortisone (ANUSOL-HC) 2.5 % rectal cream 1 application  1 application Topical QID PRN Amin, Ankit Chirag, MD       hydrocortisone cream 1 % 1 application  1 application Topical TID PRN Amin, Ankit Chirag, MD       lip balm (CARMEX) ointment 1 application  1 application Topical PRN Amin, Ankit Chirag, MD       loratadine (CLARITIN) tablet 10 mg  10 mg Oral Daily PRN Amin, Ankit Chirag, MD       Muscle Rub CREA 1 application  1 application Topical PRN Amin, Ankit Chirag, MD       ondansetron (ZOFRAN) injection 4 mg  4 mg Intravenous Q6H PRN Adhikari, Amrit, MD       pantoprazole (PROTONIX) EC tablet 40 mg  40 mg Oral Daily Adhikari, Tamsen Meek, MD   40 mg at 07/03/18 1012   phenol (CHLORASEPTIC) mouth spray 1 spray  1 spray Mouth/Throat PRN Amin, Ankit Chirag, MD       polyethylene glycol (MIRALAX / GLYCOLAX) packet 17 g  17 g Oral Daily PRN Amin, Ankit Chirag, MD       polyvinyl alcohol (LIQUIFILM TEARS) 1.4 % ophthalmic solution 1 drop  1 drop Both Eyes PRN Amin, Ankit Chirag, MD       senna-docusate (Senokot-S) tablet 2 tablet  2 tablet Oral QHS PRN Amin, Ankit Chirag, MD       sodium chloride (OCEAN) 0.65 % nasal spray 1 spray  1 spray Each Nare PRN Amin, Ankit  Chirag, MD        REVIEW OF SYSTEMS:  On review of systems, the patient's primary complaints are of progressive, generalized weakness/malaise and recent confusion which has significantly improved since starting steroids at the time of hospital dmission. He denies any chest pain, shortness of breath, cough, fevers, chills, night sweats, unintended weight changes. He denies any bowel or bladder disturbances, and denies abdominal pain, nausea or vomiting. He denies any new musculoskeletal or joint aches or pains. A complete review of systems is obtained and is otherwise negative.  PHYSICAL EXAM:  Wt Readings from Last 3 Encounters:  07/02/18 227 lb 1.2 oz (103 kg)  06/19/18 240 lb 4.8 oz (109 kg)  06/11/18 233 lb 8 oz (105.9 kg)   Temp Readings from Last 3 Encounters:  07/04/18 98.3 F (36.8 C) (Oral)  06/27/18 98.6 F (37 C) (Oral)  06/19/18 98.3 F (36.8 C) (Oral)   BP Readings from Last 3 Encounters:  07/04/18 113/82  06/27/18 118/76  06/19/18 101/72   Pulse Readings from Last 3 Encounters:  07/04/18 63  06/27/18 80  06/19/18 87   Pain Assessment Pain Score: 0-No pain/10  Unable to assess due to telephone visit format.  KPS = 40  100 - Normal; no complaints; no evidence of disease. 90   - Able to carry on normal activity; minor signs or symptoms of disease. 80   - Normal activity with effort; some signs or symptoms of disease. 70   - Cares for self; unable to carry on normal activity or to do active work. 60   - Requires occasional assistance, but is able to care for most of his personal needs. 50   - Requires considerable assistance and frequent medical care. 68   - Disabled; requires special care and assistance. 44   - Severely disabled; hospital admission is indicated although death not imminent. 85   - Very sick; hospital admission necessary; active supportive treatment necessary. 10   -  Moribund; fatal processes progressing rapidly. 0     - Dead  Karnofsky DA,  Abelmann Arnold, Craver LS and Burchenal [facility] [phone]) The use of the nitrogen mustards in the palliative treatment of carcinoma: with particular reference to bronchogenic carcinoma Cancer 1 634-56  LABORATORY DATA:  Lab Results  Component Value Date   WBC 7.4 07/04/2018   HGB 13.6 07/04/2018   HCT 42.0 07/04/2018   MCV 92.3 07/04/2018   PLT 187 07/04/2018   Lab Results  Component Value Date   NA 133 (L) 07/04/2018   K 4.2 07/04/2018   CL 97 (L) 07/04/2018   CO2 24 07/04/2018   Lab Results  Component Value Date   ALT 18 07/02/2018   AST 24 07/02/2018   ALKPHOS 85 07/02/2018   BILITOT 1.0 07/02/2018     RADIOGRAPHY: Dg Chest 2 View  Result Date: 07/02/2018 CLINICAL DATA:  Weakness, history stage IV lung cancer EXAM: CHEST - 2 VIEW COMPARISON:  Portable exam 0808 hours compared to CT chest 05/11/2018 FINDINGS: Normal heart size, mediastinal contours, and pulmonary vascularity. Mild chronic RIGHT basilar atelectasis. RIGHT suprahilar nodular density unchanged corresponding to known tumor. Lungs otherwise clear. No infiltrate, pleural effusion or pneumothorax. IMPRESSION: Known RIGHT upper lobe tumor and chronic RIGHT basilar atelectasis. No acute abnormalities. Electronically Signed   By: Lavonia Dana M.D.   On: 07/02/2018 08:15   Ct Head Wo Contrast  Result Date: 07/02/2018 CLINICAL DATA:  Patient with known metastatic lung carcinoma with increased confusion and altered mental status EXAM: CT HEAD WITHOUT CONTRAST TECHNIQUE: Contiguous axial images were obtained from the base of the skull through the vertex without intravenous contrast. COMPARISON:  Brain MRI April 27, 2018 FINDINGS: Brain: Diffuse enlargement of the ventricles is a stable finding. The sulci appear unremarkable. There is extensive vasogenic edema throughout much of the right frontal lobe which was present previously but appears slightly more extensive. No other vasogenic edema is evident. The known multiple masses throughout the  brain documented on prior MR are not delineated focally on this noncontrast enhanced study. There is no hemorrhage. There Is impression on the frontal horn the right lateral ventricle due to the edema, more extensive than on the previous study. No midline shift evident, however. No extra-axial fluid collections are appreciable. Vascular: No appreciable hyperdense vessel. There is mild calcification in each carotid siphon region. Skull: The bony calvarium appears intact. Sinuses/Orbits: There is mucosal thickening in several ethmoid air cells. Other visualized paranasal sinuses are clear. Orbits appear symmetric bilaterally. Other: Visualized mastoid air cells are clear. IMPRESSION: 1. There is extensive vasogenic edema throughout the right frontal lobe which shows a slight increase compared to prior MR from April 2020. There is more mass effect on the frontal horn the right lateral ventricle compared to the previous study. The known mass in this area is not well delineated on this noncontrast enhanced study. 2. Diffuse ventricular enlargement remains. Suspect a degree of underlying normal pressure hydrocephalus. 3. Recent MR showed multiple masses throughout the brain which are not delineated on this noncontrast enhanced study. If further evaluation of these masses and direct comparison of these masses compared to the previous study is felt to be warranted, would advise MR pre and post-contrast to further evaluate. 4. No acute infarct is demonstrable. No hemorrhage. No midline shift. 5.  Mild mucosal thickening noted in several ethmoid air cells. Electronically Signed   By: Lowella Grip III M.D.   On: 07/02/2018 08:57   Ct Chest W  Contrast  Result Date: 07/03/2018 CLINICAL DATA:  Restaging neuroendocrine carcinoma, mets to lung, brain EXAM: CT CHEST, ABDOMEN, AND PELVIS WITH CONTRAST TECHNIQUE: Multidetector CT imaging of the chest, abdomen and pelvis was performed following the standard protocol during  bolus administration of intravenous contrast. CONTRAST:  157mL OMNIPAQUE IOHEXOL 300 MG/ML SOLN, 93mL OMNIPAQUE IOHEXOL 300 MG/ML SOLN, additional oral enteric contrast COMPARISON:  CT chest abdomen pelvis, 05/11/2018 FINDINGS: CT CHEST FINDINGS Cardiovascular: Left coronary artery calcifications. Normal heart size. No pericardial effusion. Mediastinum/Nodes: No enlarged mediastinal, hilar, or axillary lymph nodes. Coarse calcifications in the anterior mediastinum. Thyroid gland, trachea, and esophagus demonstrate no significant findings. Lungs/Pleura: Significant interval decrease in size of bulky masses of the suprahilar right upper lobe, largest mass previously measuring 3.9 cm, now an irregular residual of soft tissue measuring approximately 1.7 x 0.7 cm (series 4, image 52). A larger adjacent nodule measures 1.8 x 1.5 cm, previously 3.0 x 1.9 cm when measured similarly. Redemonstrated postoperative findings of right lower lobe wedge resection. No pleural effusion or pneumothorax. Musculoskeletal: No chest wall mass. Unchanged sclerosis of the T6 vertebral body. CT ABDOMEN PELVIS FINDINGS Hepatobiliary: No solid liver abnormality is seen. No gallstones, gallbladder wall thickening, or biliary dilatation. Pancreas: Unremarkable. No pancreatic ductal dilatation or surrounding inflammatory changes. Spleen: Normal in size without significant abnormality. Adrenals/Urinary Tract: Significant interval reduction in size of a bulky left adrenal mass, now measuring 7.4 x 5.0 cm, previously 10.8 x 7.3 cm when measured similarly (series 2, image 69). A right adrenal nodule is however slightly enlarged, now measuring 2.8 x 2.2 cm, previously 2.3 x 1.6 cm when measured similarly (series 2, image 66). Punctuate nonobstructive left nephrolithiasis. No hydronephrosis. Bladder is unremarkable. Stomach/Bowel: Stomach is within normal limits. Appendix appears normal. No evidence of bowel wall thickening, distention, or inflammatory  changes. Sigmoid diverticulosis. Vascular/Lymphatic: No significant vascular findings are present. No enlarged abdominal or pelvic lymph nodes. Reproductive: No mass or other abnormality. Other: No abdominal wall hernia or abnormality. No abdominopelvic ascites. Small nodule in the left paracolic gutter measuring 1.1 cm is stable compared to immediate prior examination (not previously appreciated due to significant motion artifact in this vicinity) although new in comparison to examination dated 01/26/2018 (series 2, image 104). A previously noted peritoneal or omental nodule adjacent to hepatic flexure of the colon now measures approximately 6 mm, previously 1.2 cm (series 2, image 59). Musculoskeletal: No other acute or significant osseous findings. IMPRESSION: 1.  Evidence of mixed response of metastatic disease. 2. Significant interval decrease in size of bulky masses of the suprahilar right upper lobe, largest mass previously measuring 3.9 cm, now an irregular residual of soft tissue measuring approximately 1.7 x 0.7 cm (series 4, image 52). A larger adjacent nodule measures 1.8 x 1.5 cm, previously 3.0 x 1.9 cm when measured similarly. 3. Significant interval reduction in size of a bulky left adrenal mass, now measuring 7.4 x 5.0 cm, previously 10.8 x 7.3 cm when measured similarly (series 2, image 69). A right adrenal nodule is however slightly enlarged, now measuring 2.8 x 2.2 cm, previously 2.3 x 1.6 cm when measured similarly (series 2, image 66). 4. Small nodule in the left paracolic gutter measuring 1.1 cm is stable compared to immediate prior examination (not previously appreciated due to significant motion artifact in this vicinity) although new in comparison to examination dated 01/26/2018 (series 2, image 104). A previously noted peritoneal or omental nodule adjacent to hepatic flexure of the colon now measures approximately 6 mm,  previously 1.2 cm (series 2, image 59). 5. Unchanged sclerosis of the  T6 vertebral body, which remains suspicious for metastatic disease. 6.  Other chronic and incidental findings as detailed above. Electronically Signed   By: Eddie Candle M.D.   On: 07/03/2018 21:51   Mr Jeri Cos And Wo Contrast  Result Date: 07/02/2018 CLINICAL DATA:  Metastatic disease with progression on CT. EXAM: MRI HEAD WITHOUT AND WITH CONTRAST TECHNIQUE: Multiplanar, multiecho pulse sequences of the brain and surrounding structures were obtained without and with intravenous contrast. CONTRAST:  10 mL Gadovist IV COMPARISON:  CT head 07/02/2018, MRI head 04/27/2018 FINDINGS: Brain: Image quality degraded by significant motion. Marked ventricular enlargement similar to the prior MRI. Marked dilatation of the lateral third and fourth ventricles suggesting communicating hydrocephalus. This could be due to leptomeningeal tumor although I do not see leptomeningeal enhancement. Large enhancing mass right frontal lobe has enlarged now measuring 3.9 x 4.2 cm. Progression of surrounding edema and mass-effect on the right frontal horn. Numerous enhancing lesions are seen on the prior MRI which are not well identified on today's study due to extensive motion. Right temporal lesion may have enlarged in the interval. Negative for hemorrhage. No acute infarct. Vascular: Normal arterial flow voids Skull and upper cervical spine: Negative Sinuses/Orbits: Negative Other: None IMPRESSION: Image quality degraded by significant motion. This significantly limits evaluation of known metastatic disease to brain. Large mass in the right frontal lobe shows significant interval enlargement since the prior MRI of 04/27/2018, now measuring 3.9 x 4.2 cm. Increased edema and mass-effect. Multiple additional enhancing lesions on the prior MRI are not well seen on today's study likely due to motion. Severe communicating hydrocephalus unchanged. While this could be due to leptomeningeal carcinomatosis and obstruction of CSF resorption, no  MRI findings of leptomeningeal carcinomatosis are identified. Electronically Signed   By: Franchot Gallo M.D.   On: 07/02/2018 15:29   US Scrotum  Result Date: 07/02/2018 CLINICAL DATA:  63 year old male with history of left-sided testicular swelling. EXAM: SCROTAL ULTRASOUND DOPPLER ULTRASOUND OF THE TESTICLES TECHNIQUE: Complete ultrasound examination of the testicles, epididymis, and other scrotal structures was performed. Color and spectral Doppler ultrasound were also utilized to evaluate blood flow to the testicles. COMPARISON:  No priors. FINDINGS: Right testicle Measurements: 4.2 x 2.5 x 3.1 cm. No mass or microlithiasis visualized. Left testicle Measurements: 4.2 x 3.3 x 3.5 cm. No mass or microlithiasis visualized. Right epididymis:  Normal in size and appearance. Left epididymis:  Normal in size and appearance. Hydrocele: Left-sided hydrocele with low-level internal echogenicity. Varicocele:  None visualized. Pulsed Doppler interrogation of both testes demonstrates normal low resistance arterial and venous waveforms bilaterally. IMPRESSION: 1. Moderate left-sided hydrocele. 2. Normal sonographic appearance of the testicles and epididymides bilaterally. Electronically Signed   By: Vinnie Langton M.D.   On: 07/02/2018 19:21   Ct Abdomen Pelvis W Contrast  Result Date: 07/03/2018 CLINICAL DATA:  Restaging neuroendocrine carcinoma, mets to lung, brain EXAM: CT CHEST, ABDOMEN, AND PELVIS WITH CONTRAST TECHNIQUE: Multidetector CT imaging of the chest, abdomen and pelvis was performed following the standard protocol during bolus administration of intravenous contrast. CONTRAST:  132mL OMNIPAQUE IOHEXOL 300 MG/ML SOLN, 10mL OMNIPAQUE IOHEXOL 300 MG/ML SOLN, additional oral enteric contrast COMPARISON:  CT chest abdomen pelvis, 05/11/2018 FINDINGS: CT CHEST FINDINGS Cardiovascular: Left coronary artery calcifications. Normal heart size. No pericardial effusion. Mediastinum/Nodes: No enlarged mediastinal,  hilar, or axillary lymph nodes. Coarse calcifications in the anterior mediastinum. Thyroid gland, trachea, and esophagus demonstrate  no significant findings. Lungs/Pleura: Significant interval decrease in size of bulky masses of the suprahilar right upper lobe, largest mass previously measuring 3.9 cm, now an irregular residual of soft tissue measuring approximately 1.7 x 0.7 cm (series 4, image 52). A larger adjacent nodule measures 1.8 x 1.5 cm, previously 3.0 x 1.9 cm when measured similarly. Redemonstrated postoperative findings of right lower lobe wedge resection. No pleural effusion or pneumothorax. Musculoskeletal: No chest wall mass. Unchanged sclerosis of the T6 vertebral body. CT ABDOMEN PELVIS FINDINGS Hepatobiliary: No solid liver abnormality is seen. No gallstones, gallbladder wall thickening, or biliary dilatation. Pancreas: Unremarkable. No pancreatic ductal dilatation or surrounding inflammatory changes. Spleen: Normal in size without significant abnormality. Adrenals/Urinary Tract: Significant interval reduction in size of a bulky left adrenal mass, now measuring 7.4 x 5.0 cm, previously 10.8 x 7.3 cm when measured similarly (series 2, image 69). A right adrenal nodule is however slightly enlarged, now measuring 2.8 x 2.2 cm, previously 2.3 x 1.6 cm when measured similarly (series 2, image 66). Punctuate nonobstructive left nephrolithiasis. No hydronephrosis. Bladder is unremarkable. Stomach/Bowel: Stomach is within normal limits. Appendix appears normal. No evidence of bowel wall thickening, distention, or inflammatory changes. Sigmoid diverticulosis. Vascular/Lymphatic: No significant vascular findings are present. No enlarged abdominal or pelvic lymph nodes. Reproductive: No mass or other abnormality. Other: No abdominal wall hernia or abnormality. No abdominopelvic ascites. Small nodule in the left paracolic gutter measuring 1.1 cm is stable compared to immediate prior examination (not  previously appreciated due to significant motion artifact in this vicinity) although new in comparison to examination dated 01/26/2018 (series 2, image 104). A previously noted peritoneal or omental nodule adjacent to hepatic flexure of the colon now measures approximately 6 mm, previously 1.2 cm (series 2, image 59). Musculoskeletal: No other acute or significant osseous findings. IMPRESSION: 1.  Evidence of mixed response of metastatic disease. 2. Significant interval decrease in size of bulky masses of the suprahilar right upper lobe, largest mass previously measuring 3.9 cm, now an irregular residual of soft tissue measuring approximately 1.7 x 0.7 cm (series 4, image 52). A larger adjacent nodule measures 1.8 x 1.5 cm, previously 3.0 x 1.9 cm when measured similarly. 3. Significant interval reduction in size of a bulky left adrenal mass, now measuring 7.4 x 5.0 cm, previously 10.8 x 7.3 cm when measured similarly (series 2, image 69). A right adrenal nodule is however slightly enlarged, now measuring 2.8 x 2.2 cm, previously 2.3 x 1.6 cm when measured similarly (series 2, image 66). 4. Small nodule in the left paracolic gutter measuring 1.1 cm is stable compared to immediate prior examination (not previously appreciated due to significant motion artifact in this vicinity) although new in comparison to examination dated 01/26/2018 (series 2, image 104). A previously noted peritoneal or omental nodule adjacent to hepatic flexure of the colon now measures approximately 6 mm, previously 1.2 cm (series 2, image 59). 5. Unchanged sclerosis of the T6 vertebral body, which remains suspicious for metastatic disease. 6.  Other chronic and incidental findings as detailed above. Electronically Signed   By: Eddie Candle M.D.   On: 07/03/2018 21:51      IMPRESSION/PLAN: 1. 63 y.o. 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   Today, I talked to the patient and his wife, [name], about  the findings and workup thus far.  His recent MRI brain obtained on 07/02/2018 showed significant interval enlargement of the previously treated right frontal lobe lesion now  measuring 3.9 x 4.2 cm as compared to 1.8 cm on previous MRI in April 2020 and now with increased edema and mass-effect.  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 Cecil Cobbs, neuro oncologist, for further evaluation and discussion of treatment options at discharge.  He should remain on his current steroid dose for symptom management until further evaluated with Dr. Mickeal Skinner.  He will also continue in routine follow-up with Dr. Julien Nordmann for continued systemic disease management.  His recent systemic imaging for disease restaging on 07/03/18 shows a mixed response to systemic treatment with slight enlargement of a right adrenal nodule but otherwise, significant improvement in his disease in the RUL, left adrenal mass and other nodules within the abdomen. The patient and his wife were encouraged to ask questions that were answered to their stated satisfaction.   Given current concerns for patient exposure during the COVID-19 pandemic, this encounter was conducted via telephone.  The patient has given verbal consent for this type of encounter. The attendants for this meeting include Rainelle Sulewski PA-C, patient, Guhan Bruington and his wife, [name]. During the encounter,  Jajaira Ruis PA-C, was located at [facility] Radiation Oncology Department.  Patient, Gregoire Bennis, was located at [facility], room [id number].  His wife, [name], was located at home.  In a visit lasting 70 minutes, greater than 50% of that time was spent in telephone conversation with the patient and his wife, discussing his case and  coordinating his care.   Nicholos Johns, PA-C    Tyler Pita, MD  [facility] Direct Dial: [phone]   Fax: [phone] [email or web address]   Skype   LinkedIn

## 2018-07-04 NOTE — TOC Initial Note (Signed)
Transition of Care [facility]) - Initial/Assessment Note    Patient Details  Name: Dwayne Massey MRN: [id number] Date of Birth: Mar 15, 1955  Transition of Care [facility]) CM/SW Contact:    Wende Neighbors, LCSW Phone Number: 07/04/2018, 4:20 PM  Clinical Narrative:   CSW met patient at bedside to discuss discharge plan, CSW was unable to wake patient to do assessment. CSW spoke with patients wife via phone and she stated she would prefer to have patient home with her where she can see him everyday and care for him. Dwayne Massey stated that she does not want him to go to a facility because she's afraid he might be exposed to covid at a facility and she would not have access to him. Dwayne Massey requested patient comes home with [facility]. CSW is trying to find a H.H agency that is in network with [employer].                Expected Discharge Plan: [place] Barriers to Discharge: Continued Medical Work up   Patient Goals and CMS Choice Patient states their goals for this hospitalization and ongoing recovery are:: unable to talk to patient CMS Medicare.gov Compare Post Acute Care list provided to:: (spoke with wife) Choice offered to / list presented to : Spouse  Expected Discharge Plan and Services Expected Discharge Plan: [place] In-house Referral: Clinical Social Work   Post Acute Care Choice: [place] arrangements for the past 2 months: [place] Expected Discharge Date: (unknown)                                    Prior Living Arrangements/Services Living arrangements for the past 2 months: Single Family Home Lives with:: Self, Spouse Patient language and need for interpreter reviewed:: Yes Do you feel safe going back to the place where you live?: Yes      Need for Family Participation in Patient Care: Yes (Comment) Care giver support system in place?: Yes (comment)   Criminal Activity/Legal Involvement Pertinent to Current  Situation/Hospitalization: No - Comment as needed  Activities of Daily Living Home Assistive Devices/Equipment: Eyeglasses ADL Screening (condition at time of admission) Patient's cognitive ability adequate to safely complete daily activities?: No Is the patient deaf or have difficulty hearing?: No Does the patient have difficulty seeing, even when wearing glasses/contacts?: No Does the patient have difficulty concentrating, remembering, or making decisions?: Yes Patient able to express need for assistance with ADLs?: Yes Does the patient have difficulty dressing or bathing?: Yes Independently performs ADLs?: No Communication: Independent Dressing (OT): Dependent Is this a change from baseline?: Change from baseline, expected to last >3 days Grooming: Dependent Is this a change from baseline?: Change from baseline, expected to last >3 days Feeding: Dependent Is this a change from baseline?: Change from baseline, expected to last >3 days Bathing: Dependent Is this a change from baseline?: Change from baseline, expected to last >3 days Toileting: Dependent Is this a change from baseline?: Change from baseline, expected to last >3days In/Out Bed: Dependent Is this a change from baseline?: Change from baseline, expected to last >3 days Walks in Home: Dependent Is this a change from baseline?: Change from baseline, expected to last >3 days Does the patient have difficulty walking or climbing stairs?: Yes Weakness of Legs: Both Weakness of Arms/Hands: Both  Permission Sought/Granted Permission sought to share information with : Family  Supports Permission granted to share information with : Yes, Verbal Permission Granted  Share Information with NAME: Louanna Raw     Permission granted to share info w Relationship: spouse  Permission granted to share info w Contact Information: [phone]  Emotional Assessment Appearance:: Appears stated age Attitude/Demeanor/Rapport: Unable to  Assess Affect (typically observed): Unable to Assess Orientation: : Oriented to Self, Oriented to Place, Oriented to  Time, Oriented to Situation Alcohol / Substance Use: Not Applicable Psych Involvement: No (comment)  Admission diagnosis:  Metastatic cancer to brain [facility]) [C79.31] Testicular swelling, left [N50.89] AMS (altered mental status) [R41.82] Patient Active Problem List   Diagnosis Date Noted  . Pressure injury of skin 07/03/2018  . Palliative care by specialist   . AMS (altered mental status) 07/02/2018  . Brain edema ([place]) 07/02/2018  . Encounter for antineoplastic chemotherapy 05/14/2018  . Metastatic cancer to brain ([place]) 05/02/2018  . Malignant neoplasm of unspecified part of unspecified bronchus or lung ([place]) 12/25/2017  . 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 Goals of care, counseling/discussion 12/25/2017   PCP:  Patient, No Pcp Per Pharmacy:   [facility], [address] [place] [address] Phone: [phone] Fax: [phone]     Social Determinants of Health (SDOH) Interventions    Readmission Risk Interventions No flowsheet data found.

## 2018-07-04 NOTE — Progress Notes (Signed)
PROGRESS NOTE    Dwayne Massey  LKG:[id number] DOB: [birth year] DOA: 07/02/2018 PCP: Patient, No Pcp Per   Brief Narrative:   63 year old with history of lung cancer with brain metastases, neuroendocrine tumor presented to the hospital with worsening confusion and weakness.  MRI performed in the hospital showed large mass in the right frontal lobe which is enlarged since April.  Oncology team consulted. Case was discussed with Radiation Onc, who thinks its due to tumor necrosis vs tumor itself. Plan on referral to outpatient Neuro Onc, Dr Mickeal Skinner. In the meantime PT rec- SNF.   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 Cont Decadron 4 mg every 6 hours. Unsure about his prognosis but I have encouraged the patient to start thinking about goals of care and have discussion with his wife.  Patient and family both aware of this and will start having this discussion.  Left testicular scrotal enlargement with moderate left-sided hydrocele -Supportive care.  No evidence of epididymitis or testicular torsion.  Continue to monitor.  Altered mental status and generalized weakness -Secondary to large frontal lobe mass.  PT/OT.  DVT prophylaxis: SCDs Code Status: Full  Family Communication:  None at bedside.  Disposition Plan: Will transition him to SNF.   Consultants:   Oncology  Radiation Oncology  Procedures:   None  Antimicrobials:   None   Subjective: Feels quite weak and unsteady but no other complaints.   Review of Systems Otherwise negative  except as per HPI, including: General = no fevers, chills, dizziness, malaise, fatigue HEENT/EYES = negative for pain, redness, loss of vision, double vision, blurred vision, loss of hearing, sore throat, hoarseness, dysphagia Cardiovascular= negative for chest pain, palpitation, murmurs, lower extremity swelling Respiratory/lungs= negative for shortness of breath, cough, hemoptysis, wheezing, mucus production Gastrointestinal= negative for nausea, vomiting,, abdominal pain, melena, hematemesis Genitourinary= negative for Dysuria, Hematuria, Change in Urinary Frequency MSK = Negative for arthralgia, myalgias, Back Pain, Joint swelling  Neurology= Negative for headache, seizures, numbness, tingling  Psychiatry= Negative for anxiety, depression, suicidal and homocidal ideation Allergy/Immunology= Medication/Food allergy as listed  Skin= Negative for Rash, lesions, ulcers, itching   Objective: Vitals:   07/03/18 0440 07/03/18 1357 07/03/18 2218 07/04/18 0542  BP: 107/72 118/81 113/88 113/82  Pulse: 72 81 79 63  Resp: 18 18 18    Temp: 97.9 F (36.6 C) 98.1 F (36.7 C) 98.4 F (36.9 C) 98.3 F (36.8 C)  TempSrc: Oral Oral  Oral  SpO2: 97% 98% 96% 97%  Weight:      Height:        Intake/Output Summary (Last 24 hours) at 07/04/2018 1237 Last data filed at 07/04/2018 0900 Gross per 24 hour  Intake 360 ml  Output 150 ml  Net 210 ml   Filed Weights   07/02/18 0727 07/02/18 1953  Weight: 109 kg 103 kg    Examination:  Constitutional: NAD Eyes: PERRL, lids and conjunctivae normal ENMT: Mucous membranes are moist. Posterior pharynx clear of any exudate or lesions.Normal dentition.  Neck: normal, supple, no masses, no thyromegaly  Respiratory: clear to auscultation bilaterally, no wheezing, no crackles. Normal respiratory effort. No accessory muscle use.  Cardiovascular: Regular rate and rhythm, no murmurs / rubs / gallops. No extremity edema. 2+ pedal pulses. No carotid bruits.    Abdomen: no tenderness, no masses palpated. No hepatosplenomegaly. Bowel sounds positive.  Musculoskeletal: no clubbing / cyanosis. No joint deformity upper and lower extremities. Good ROM, no contractures. Normal muscle tone.  Skin: no rashes, lesions, ulcers. No induration Neurologic: CN 2-12 grossly intact. Sensation intact, DTR normal. Strength 4/5 in all 4.  Psychiatric: Normal judgment and insight. Alert and oriented x 3. Normal mood.     Data Reviewed:   CBC: Recent Labs  Lab 06/27/18 1503 07/02/18 0746 07/04/18 0433  WBC 2.2* 5.9 7.4  NEUTROABS 1.6* 5.1  --   HGB 13.6 12.9* 13.6  HCT 40.5 39.1 42.0  MCV 89.6 91.6 92.3  PLT 313 172 409   Basic Metabolic Panel: Recent Labs  Lab 06/27/18 1503 07/02/18 0746 07/04/18 0433  NA 132* 133* 133*  K 3.8 3.8 4.2  CL 100 97* 97*  CO2 24 23 24   GLUCOSE 140* 110* 132*  BUN 13 12 19   CREATININE 0.94 0.92 0.86  CALCIUM 9.2 8.8* 9.2  MG 1.6* 1.7 2.2   GFR: Estimated Creatinine Clearance: 106.5 mL/min (by C-G formula based on SCr of 0.86 mg/dL). Liver Function Tests: Recent Labs  Lab 06/27/18 1503 07/02/18 0746  AST 16 24  ALT 13 18  ALKPHOS 104 85  BILITOT 0.5 1.0  PROT 6.8 6.3*  ALBUMIN 3.6 3.6   Recent Labs  Lab 07/02/18 0746  LIPASE 24   No results for input(s): AMMONIA in the last 168 hours. Coagulation Profile: No results for input(s): INR, PROTIME in the last 168 hours. Cardiac Enzymes: Recent Labs  Lab 07/02/18 0746  CKTOTAL 299  TROPONINI <0.03   BNP (last 3 results) No results for input(s): PROBNP in the last 8760 hours. HbA1C: No results for input(s): HGBA1C in the last 72 hours. CBG: No results for input(s): GLUCAP in the last 168 hours. Lipid Profile: No results for input(s): CHOL, HDL, LDLCALC, TRIG, CHOLHDL, LDLDIRECT in the last 72 hours. Thyroid Function Tests: Recent Labs    07/03/18 0544  TSH 0.528   Anemia Panel: No results for input(s): VITAMINB12, FOLATE, FERRITIN, TIBC,  IRON, RETICCTPCT in the last 72 hours. Sepsis Labs: Recent Labs  Lab 07/02/18 0746  LATICACIDVEN 1.2    Recent Results (from the past 240 hour(s))  Urine culture     Status: None   Collection Time: 07/02/18  7:41 AM   Specimen: Urine, Random  Result Value Ref Range Status   Specimen Description   Final    URINE, RANDOM Performed at [facility] [address], [place], [place] [address]    Special Requests   Final    NONE Performed at [facility], [place] [address] [place], [place] [address]    Culture   Final    NO GROWTH Performed at [facility], [place] [address], [place], [place] [address]    Report Status 07/03/2018 FINAL  Final  SARS Coronavirus 2 (CEPHEID - Performed in [facility] hospital lab), Hosp Order     Status: None   Collection Time: 07/02/18  9:16 AM   Specimen: Nasopharyngeal Swab  Result Value Ref Range Status   SARS Coronavirus 2 NEGATIVE NEGATIVE Final    Comment: (NOTE) If result is NEGATIVE SARS-CoV-2 target nucleic acids  are NOT DETECTED. The SARS-CoV-2 RNA is generally detectable in upper and lower  respiratory specimens during the acute phase of infection. The lowest  concentration of SARS-CoV-2 viral copies this assay can detect is 250  copies / mL. A negative result does not preclude SARS-CoV-2 infection  and should not be used as the sole basis for treatment or other  patient management decisions.  A negative result may occur with  improper specimen collection / handling, submission of specimen other  than nasopharyngeal swab, presence of viral mutation(s) within the  areas targeted by this assay, and inadequate number of viral copies  (<250 copies / mL). A negative result must be combined with clinical  observations, patient history, and epidemiological information. If result is POSITIVE SARS-CoV-2 target nucleic acids are DETECTED. The SARS-CoV-2 RNA is generally detectable in upper and lower    respiratory specimens dur ing the acute phase of infection.  Positive  results are indicative of active infection with SARS-CoV-2.  Clinical  correlation with patient history and other diagnostic information is  necessary to determine patient infection status.  Positive results do  not rule out bacterial infection or co-infection with other viruses. If result is PRESUMPTIVE POSTIVE SARS-CoV-2 nucleic acids MAY BE PRESENT.   A presumptive positive result was obtained on the submitted specimen  and confirmed on repeat testing.  While 2019 novel coronavirus  (SARS-CoV-2) nucleic acids may be present in the submitted sample  additional confirmatory testing may be necessary for epidemiological  and / or clinical management purposes  to differentiate between  SARS-CoV-2 and other Sarbecovirus currently known to infect humans.  If clinically indicated additional testing with an alternate test  methodology [phone]) is advised. The SARS-CoV-2 RNA is generally  detectable in upper and lower respiratory sp ecimens during the acute  phase of infection. The expected result is Negative. Fact Sheet for Patients:  StrictlyIdeas.no Fact Sheet for Healthcare Providers: BankingDealers.co.za This test is not yet approved or cleared by the [place] FDA and has been authorized for detection and/or diagnosis of SARS-CoV-2 by FDA under an Emergency Use Authorization (EUA).  This EUA will remain in effect (meaning this test can be used) for the duration of the COVID-19 declaration under Section 564(b)(1) of the Act, 21 U.S.C. section 360bbb-3(b)(1), unless the authorization is terminated or revoked sooner. Performed at [facility], [place] [address] [place], [place] [address]          Radiology Studies: Ct Chest W Contrast  Result Date: 07/03/2018 CLINICAL DATA:  Restaging neuroendocrine carcinoma, mets to lung, brain EXAM: CT  CHEST, ABDOMEN, AND PELVIS WITH CONTRAST TECHNIQUE: Multidetector CT imaging of the chest, abdomen and pelvis was performed following the standard protocol during bolus administration of intravenous contrast. CONTRAST:  110mL OMNIPAQUE IOHEXOL 300 MG/ML SOLN, 75mL OMNIPAQUE IOHEXOL 300 MG/ML SOLN, additional oral enteric contrast COMPARISON:  CT chest abdomen pelvis, 05/11/2018 FINDINGS: CT CHEST FINDINGS Cardiovascular: Left coronary artery calcifications. Normal heart size. No pericardial effusion. Mediastinum/Nodes: No enlarged mediastinal, hilar, or axillary lymph nodes. Coarse calcifications in the anterior mediastinum. Thyroid gland, trachea, and esophagus demonstrate no significant findings. Lungs/Pleura: Significant interval decrease in size of bulky masses of the suprahilar right upper lobe, largest mass previously measuring 3.9 cm, now an irregular residual of soft tissue measuring approximately 1.7 x 0.7 cm (series 4, image 52). A larger adjacent nodule measures 1.8 x 1.5 cm, previously 3.0 x 1.9 cm when measured similarly. Redemonstrated postoperative findings of right lower lobe wedge resection.  No pleural effusion or pneumothorax. Musculoskeletal: No chest wall mass. Unchanged sclerosis of the T6 vertebral body. CT ABDOMEN PELVIS FINDINGS Hepatobiliary: No solid liver abnormality is seen. No gallstones, gallbladder wall thickening, or biliary dilatation. Pancreas: Unremarkable. No pancreatic ductal dilatation or surrounding inflammatory changes. Spleen: Normal in size without significant abnormality. Adrenals/Urinary Tract: Significant interval reduction in size of a bulky left adrenal mass, now measuring 7.4 x 5.0 cm, previously 10.8 x 7.3 cm when measured similarly (series 2, image 69). A right adrenal nodule is however slightly enlarged, now measuring 2.8 x 2.2 cm, previously 2.3 x 1.6 cm when measured similarly (series 2, image 66). Punctuate nonobstructive left nephrolithiasis. No hydronephrosis.  Bladder is unremarkable. Stomach/Bowel: Stomach is within normal limits. Appendix appears normal. No evidence of bowel wall thickening, distention, or inflammatory changes. Sigmoid diverticulosis. Vascular/Lymphatic: No significant vascular findings are present. No enlarged abdominal or pelvic lymph nodes. Reproductive: No mass or other abnormality. Other: No abdominal wall hernia or abnormality. No abdominopelvic ascites. Small nodule in the left paracolic gutter measuring 1.1 cm is stable compared to immediate prior examination (not previously appreciated due to significant motion artifact in this vicinity) although new in comparison to examination dated 01/26/2018 (series 2, image 104). A previously noted peritoneal or omental nodule adjacent to hepatic flexure of the colon now measures approximately 6 mm, previously 1.2 cm (series 2, image 59). Musculoskeletal: No other acute or significant osseous findings. IMPRESSION: 1.  Evidence of mixed response of metastatic disease. 2. Significant interval decrease in size of bulky masses of the suprahilar right upper lobe, largest mass previously measuring 3.9 cm, now an irregular residual of soft tissue measuring approximately 1.7 x 0.7 cm (series 4, image 52). A larger adjacent nodule measures 1.8 x 1.5 cm, previously 3.0 x 1.9 cm when measured similarly. 3. Significant interval reduction in size of a bulky left adrenal mass, now measuring 7.4 x 5.0 cm, previously 10.8 x 7.3 cm when measured similarly (series 2, image 69). A right adrenal nodule is however slightly enlarged, now measuring 2.8 x 2.2 cm, previously 2.3 x 1.6 cm when measured similarly (series 2, image 66). 4. Small nodule in the left paracolic gutter measuring 1.1 cm is stable compared to immediate prior examination (not previously appreciated due to significant motion artifact in this vicinity) although new in comparison to examination dated 01/26/2018 (series 2, image 104). A previously noted  peritoneal or omental nodule adjacent to hepatic flexure of the colon now measures approximately 6 mm, previously 1.2 cm (series 2, image 59). 5. Unchanged sclerosis of the T6 vertebral body, which remains suspicious for metastatic disease. 6.  Other chronic and incidental findings as detailed above. Electronically Signed   By: Eddie Candle M.D.   On: 07/03/2018 21:51   Mr Jeri Cos And Wo Contrast  Result Date: 07/02/2018 CLINICAL DATA:  Metastatic disease with progression on CT. EXAM: MRI HEAD WITHOUT AND WITH CONTRAST TECHNIQUE: Multiplanar, multiecho pulse sequences of the brain and surrounding structures were obtained without and with intravenous contrast. CONTRAST:  10 mL Gadovist IV COMPARISON:  CT head 07/02/2018, MRI head 04/27/2018 FINDINGS: Brain: Image quality degraded by significant motion. Marked ventricular enlargement similar to the prior MRI. Marked dilatation of the lateral third and fourth ventricles suggesting communicating hydrocephalus. This could be due to leptomeningeal tumor although I do not see leptomeningeal enhancement. Large enhancing mass right frontal lobe has enlarged now measuring 3.9 x 4.2 cm. Progression of surrounding edema and mass-effect on the right frontal horn. Numerous  enhancing lesions are seen on the prior MRI which are not well identified on today's study due to extensive motion. Right temporal lesion may have enlarged in the interval. Negative for hemorrhage. No acute infarct. Vascular: Normal arterial flow voids Skull and upper cervical spine: Negative Sinuses/Orbits: Negative Other: None IMPRESSION: Image quality degraded by significant motion. This significantly limits evaluation of known metastatic disease to brain. Large mass in the right frontal lobe shows significant interval enlargement since the prior MRI of 04/27/2018, now measuring 3.9 x 4.2 cm. Increased edema and mass-effect. Multiple additional enhancing lesions on the prior MRI are not well seen on  today's study likely due to motion. Severe communicating hydrocephalus unchanged. While this could be due to leptomeningeal carcinomatosis and obstruction of CSF resorption, no MRI findings of leptomeningeal carcinomatosis are identified. Electronically Signed   By: Franchot Gallo M.D.   On: 07/02/2018 15:29   US Scrotum  Result Date: 07/02/2018 CLINICAL DATA:  62 year old male with history of left-sided testicular swelling. EXAM: SCROTAL ULTRASOUND DOPPLER ULTRASOUND OF THE TESTICLES TECHNIQUE: Complete ultrasound examination of the testicles, epididymis, and other scrotal structures was performed. Color and spectral Doppler ultrasound were also utilized to evaluate blood flow to the testicles. COMPARISON:  No priors. FINDINGS: Right testicle Measurements: 4.2 x 2.5 x 3.1 cm. No mass or microlithiasis visualized. Left testicle Measurements: 4.2 x 3.3 x 3.5 cm. No mass or microlithiasis visualized. Right epididymis:  Normal in size and appearance. Left epididymis:  Normal in size and appearance. Hydrocele: Left-sided hydrocele with low-level internal echogenicity. Varicocele:  None visualized. Pulsed Doppler interrogation of both testes demonstrates normal low resistance arterial and venous waveforms bilaterally. IMPRESSION: 1. Moderate left-sided hydrocele. 2. Normal sonographic appearance of the testicles and epididymides bilaterally. Electronically Signed   By: Vinnie Langton M.D.   On: 07/02/2018 19:21   Ct Abdomen Pelvis W Contrast  Result Date: 07/03/2018 CLINICAL DATA:  Restaging neuroendocrine carcinoma, mets to lung, brain EXAM: CT CHEST, ABDOMEN, AND PELVIS WITH CONTRAST TECHNIQUE: Multidetector CT imaging of the chest, abdomen and pelvis was performed following the standard protocol during bolus administration of intravenous contrast. CONTRAST:  176mL OMNIPAQUE IOHEXOL 300 MG/ML SOLN, 62mL OMNIPAQUE IOHEXOL 300 MG/ML SOLN, additional oral enteric contrast COMPARISON:  CT chest abdomen pelvis,  05/11/2018 FINDINGS: CT CHEST FINDINGS Cardiovascular: Left coronary artery calcifications. Normal heart size. No pericardial effusion. Mediastinum/Nodes: No enlarged mediastinal, hilar, or axillary lymph nodes. Coarse calcifications in the anterior mediastinum. Thyroid gland, trachea, and esophagus demonstrate no significant findings. Lungs/Pleura: Significant interval decrease in size of bulky masses of the suprahilar right upper lobe, largest mass previously measuring 3.9 cm, now an irregular residual of soft tissue measuring approximately 1.7 x 0.7 cm (series 4, image 52). A larger adjacent nodule measures 1.8 x 1.5 cm, previously 3.0 x 1.9 cm when measured similarly. Redemonstrated postoperative findings of right lower lobe wedge resection. No pleural effusion or pneumothorax. Musculoskeletal: No chest wall mass. Unchanged sclerosis of the T6 vertebral body. CT ABDOMEN PELVIS FINDINGS Hepatobiliary: No solid liver abnormality is seen. No gallstones, gallbladder wall thickening, or biliary dilatation. Pancreas: Unremarkable. No pancreatic ductal dilatation or surrounding inflammatory changes. Spleen: Normal in size without significant abnormality. Adrenals/Urinary Tract: Significant interval reduction in size of a bulky left adrenal mass, now measuring 7.4 x 5.0 cm, previously 10.8 x 7.3 cm when measured similarly (series 2, image 69). A right adrenal nodule is however slightly enlarged, now measuring 2.8 x 2.2 cm, previously 2.3 x 1.6 cm when measured similarly (series  2, image 66). Punctuate nonobstructive left nephrolithiasis. No hydronephrosis. Bladder is unremarkable. Stomach/Bowel: Stomach is within normal limits. Appendix appears normal. No evidence of bowel wall thickening, distention, or inflammatory changes. Sigmoid diverticulosis. Vascular/Lymphatic: No significant vascular findings are present. No enlarged abdominal or pelvic lymph nodes. Reproductive: No mass or other abnormality. Other: No  abdominal wall hernia or abnormality. No abdominopelvic ascites. Small nodule in the left paracolic gutter measuring 1.1 cm is stable compared to immediate prior examination (not previously appreciated due to significant motion artifact in this vicinity) although new in comparison to examination dated 01/26/2018 (series 2, image 104). A previously noted peritoneal or omental nodule adjacent to hepatic flexure of the colon now measures approximately 6 mm, previously 1.2 cm (series 2, image 59). Musculoskeletal: No other acute or significant osseous findings. IMPRESSION: 1.  Evidence of mixed response of metastatic disease. 2. Significant interval decrease in size of bulky masses of the suprahilar right upper lobe, largest mass previously measuring 3.9 cm, now an irregular residual of soft tissue measuring approximately 1.7 x 0.7 cm (series 4, image 52). A larger adjacent nodule measures 1.8 x 1.5 cm, previously 3.0 x 1.9 cm when measured similarly. 3. Significant interval reduction in size of a bulky left adrenal mass, now measuring 7.4 x 5.0 cm, previously 10.8 x 7.3 cm when measured similarly (series 2, image 69). A right adrenal nodule is however slightly enlarged, now measuring 2.8 x 2.2 cm, previously 2.3 x 1.6 cm when measured similarly (series 2, image 66). 4. Small nodule in the left paracolic gutter measuring 1.1 cm is stable compared to immediate prior examination (not previously appreciated due to significant motion artifact in this vicinity) although new in comparison to examination dated 01/26/2018 (series 2, image 104). A previously noted peritoneal or omental nodule adjacent to hepatic flexure of the colon now measures approximately 6 mm, previously 1.2 cm (series 2, image 59). 5. Unchanged sclerosis of the T6 vertebral body, which remains suspicious for metastatic disease. 6.  Other chronic and incidental findings as detailed above. Electronically Signed   By: Eddie Candle M.D.   On: 07/03/2018 21:51          Scheduled Meds:  dexamethasone  4 mg Intravenous Q6H   pantoprazole  40 mg Oral Daily   Continuous Infusions:   LOS: 2 days   Time spent=25 mins    Caylon Saine Arsenio Loader, MD [facility]  If 7PM-7AM, please contact night-coverage [email or web address] 07/04/2018, 12:37 PM

## 2018-07-04 NOTE — Progress Notes (Signed)
HEMATOLOGY-ONCOLOGY PROGRESS NOTE  SUBJECTIVE: The patient feels better today. States he has been up walking a little. Denies headaches. No chest pain or shortness of breath. He is afebrile and other VS are stable.   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 ([place])  12/25/2017 Initial Diagnosis   Neuroendocrine carcinoma metastatic to lung ([place])   05/21/2018 -  Chemotherapy   The patient had palonosetron (ALOXI) injection 0.25 mg, 0.25 mg, Intravenous,  Once, 2 of 6 cycles Administration: 0.25 mg (05/21/2018), 0.25 mg (05/29/2018), 0.25 mg (06/19/2018) irinotecan (CAMPTOSAR) 160 mg in dextrose 5 % 500 mL chemo infusion, 65 mg/m2 = 160 mg, Intravenous,  Once, 2 of 6 cycles Dose modification: 50 mg/m2 (original dose 65 mg/m2, Cycle 5, Reason: Dose not tolerated) Administration: 160 mg (05/21/2018), 160 mg (05/29/2018), 120 mg (06/19/2018) CISplatin (PLATINOL) 74 mg in sodium chloride 0.9 % 250 mL chemo infusion, 30 mg/m2 = 74 mg, Intravenous,  Once, 2 of 6 cycles Dose modification: 25 mg/m2 (original dose 30 mg/m2, Cycle 5, Reason: Dose not tolerated) Administration: 74 mg (05/21/2018), 74 mg (05/29/2018), 62 mg (06/19/2018) fosaprepitant (EMEND) 150 mg, dexamethasone (DECADRON) 12 mg in sodium chloride 0.9 % 145 mL IVPB, , Intravenous,  Once, 2 of 6 cycles Administration:  (05/21/2018),  (05/29/2018),  (06/19/2018)  for chemotherapy treatment.       REVIEW OF SYSTEMS:   Constitutional: Denies fevers, chills.  Has generalized weakness. Eyes: Denies blurriness of vision Ears, nose, mouth, throat, and face: Denies mucositis or sore throat Respiratory: Denies cough, dyspnea or wheezes Cardiovascular: Denies palpitation, chest discomfort Gastrointestinal:  Denies nausea, heartburn or change in bowel habits Skin: Denies abnormal skin rashes Lymphatics: Denies new lymphadenopathy or easy bruising Neurological:Denies numbness, tingling or new weaknesses Behavioral/Psych: Mood is stable,  no new changes  Extremities: No lower extremity edema All other systems were reviewed with the patient and are negative.  I have reviewed the past medical history, past surgical history, social history and family history with the patient and they are unchanged from previous note.   PHYSICAL EXAMINATION: ECOG PERFORMANCE STATUS: 2 - Symptomatic, <50% confined to bed  Vitals:   07/03/18 2218 07/04/18 0542  BP: 113/88 113/82  Pulse: 79 63  Resp: 18   Temp: 98.4 F (36.9 C) 98.3 F (36.8 C)  SpO2: 96% 97%   Filed Weights   07/02/18 0727 07/02/18 1953  Weight: 240 lb 4.8 oz (109 kg) 227 lb 1.2 oz (103 kg)    Intake/Output from previous day: 06/23 0701 - 06/24 0700 In: 360 [P.O.:360] Out: 150 [Urine:150]  GENERAL:alert, no distress and comfortable SKIN: skin color, texture, turgor are normal, no rashes or significant lesions EYES: normal, Conjunctiva are pink and non-injected, sclera clear. EOMI. PERRL. OROPHARYNX:no exudate, no erythema and lips, buccal mucosa, and tongue normal  NECK: supple, thyroid normal size, non-tender, without nodularity LYMPH:  no palpable lymphadenopathy in the cervical, axillary or inguinal LUNGS: clear to auscultation and percussion with normal breathing effort HEART: regular rate & rhythm and no murmurs and no lower extremity edema ABDOMEN:abdomen soft, non-tender and normal bowel sounds Musculoskeletal:no cyanosis of digits and no clubbing  NEURO: alert & oriented x 3 with fluent speech, no focal motor/sensory deficits  LABORATORY DATA:  I have reviewed the data as listed CMP Latest Ref Rng & Units 07/04/2018 07/02/2018 06/27/2018  Glucose 70 - 99 mg/dL 132(H) 110(H) 140(H)  BUN 8 - 23 mg/dL 19 12 13   Creatinine 0.61 - 1.24 mg/dL 0.86 0.92 0.94  Sodium  135 - 145 mmol/L 133(L) 133(L) 132(L)  Potassium 3.5 - 5.1 mmol/L 4.2 3.8 3.8  Chloride 98 - 111 mmol/L 97(L) 97(L) 100  CO2 22 - 32 mmol/L 24 23 24   Calcium 8.9 - 10.3 mg/dL 9.2 8.8(L) 9.2   Total Protein 6.5 - 8.1 g/dL - 6.3(L) 6.8  Total Bilirubin 0.3 - 1.2 mg/dL - 1.0 0.5  Alkaline Phos 38 - 126 U/L - 85 104  AST 15 - 41 U/L - 24 16  ALT 0 - 44 U/L - 18 13    Lab Results  Component Value Date   WBC 7.4 07/04/2018   HGB 13.6 07/04/2018   HCT 42.0 07/04/2018   MCV 92.3 07/04/2018   PLT 187 07/04/2018   NEUTROABS 5.1 07/02/2018    Dg Chest 2 View  Result Date: 07/02/2018 CLINICAL DATA:  Weakness, history stage IV lung cancer EXAM: CHEST - 2 VIEW COMPARISON:  Portable exam 0808 hours compared to CT chest 05/11/2018 FINDINGS: Normal heart size, mediastinal contours, and pulmonary vascularity. Mild chronic RIGHT basilar atelectasis. RIGHT suprahilar nodular density unchanged corresponding to known tumor. Lungs otherwise clear. No infiltrate, pleural effusion or pneumothorax. IMPRESSION: Known RIGHT upper lobe tumor and chronic RIGHT basilar atelectasis. No acute abnormalities. Electronically Signed   By: Lavonia Dana M.D.   On: 07/02/2018 08:15   Ct Head Wo Contrast  Result Date: 07/02/2018 CLINICAL DATA:  Patient with known metastatic lung carcinoma with increased confusion and altered mental status EXAM: CT HEAD WITHOUT CONTRAST TECHNIQUE: Contiguous axial images were obtained from the base of the skull through the vertex without intravenous contrast. COMPARISON:  Brain MRI April 27, 2018 FINDINGS: Brain: Diffuse enlargement of the ventricles is a stable finding. The sulci appear unremarkable. There is extensive vasogenic edema throughout much of the right frontal lobe which was present previously but appears slightly more extensive. No other vasogenic edema is evident. The known multiple masses throughout the brain documented on prior MR are not delineated focally on this noncontrast enhanced study. There is no hemorrhage. There Is impression on the frontal horn the right lateral ventricle due to the edema, more extensive than on the previous study. No midline shift evident,  however. No extra-axial fluid collections are appreciable. Vascular: No appreciable hyperdense vessel. There is mild calcification in each carotid siphon region. Skull: The bony calvarium appears intact. Sinuses/Orbits: There is mucosal thickening in several ethmoid air cells. Other visualized paranasal sinuses are clear. Orbits appear symmetric bilaterally. Other: Visualized mastoid air cells are clear. IMPRESSION: 1. There is extensive vasogenic edema throughout the right frontal lobe which shows a slight increase compared to prior MR from April 2020. There is more mass effect on the frontal horn the right lateral ventricle compared to the previous study. The known mass in this area is not well delineated on this noncontrast enhanced study. 2. Diffuse ventricular enlargement remains. Suspect a degree of underlying normal pressure hydrocephalus. 3. Recent MR showed multiple masses throughout the brain which are not delineated on this noncontrast enhanced study. If further evaluation of these masses and direct comparison of these masses compared to the previous study is felt to be warranted, would advise MR pre and post-contrast to further evaluate. 4. No acute infarct is demonstrable. No hemorrhage. No midline shift. 5.  Mild mucosal thickening noted in several ethmoid air cells. Electronically Signed   By: Lowella Grip III M.D.   On: 07/02/2018 08:57   Ct Chest W Contrast  Result Date: 07/03/2018 CLINICAL DATA:  Restaging  neuroendocrine carcinoma, mets to lung, brain EXAM: CT CHEST, ABDOMEN, AND PELVIS WITH CONTRAST TECHNIQUE: Multidetector CT imaging of the chest, abdomen and pelvis was performed following the standard protocol during bolus administration of intravenous contrast. CONTRAST:  130mL OMNIPAQUE IOHEXOL 300 MG/ML SOLN, 25mL OMNIPAQUE IOHEXOL 300 MG/ML SOLN, additional oral enteric contrast COMPARISON:  CT chest abdomen pelvis, 05/11/2018 FINDINGS: CT CHEST FINDINGS Cardiovascular: Left  coronary artery calcifications. Normal heart size. No pericardial effusion. Mediastinum/Nodes: No enlarged mediastinal, hilar, or axillary lymph nodes. Coarse calcifications in the anterior mediastinum. Thyroid gland, trachea, and esophagus demonstrate no significant findings. Lungs/Pleura: Significant interval decrease in size of bulky masses of the suprahilar right upper lobe, largest mass previously measuring 3.9 cm, now an irregular residual of soft tissue measuring approximately 1.7 x 0.7 cm (series 4, image 52). A larger adjacent nodule measures 1.8 x 1.5 cm, previously 3.0 x 1.9 cm when measured similarly. Redemonstrated postoperative findings of right lower lobe wedge resection. No pleural effusion or pneumothorax. Musculoskeletal: No chest wall mass. Unchanged sclerosis of the T6 vertebral body. CT ABDOMEN PELVIS FINDINGS Hepatobiliary: No solid liver abnormality is seen. No gallstones, gallbladder wall thickening, or biliary dilatation. Pancreas: Unremarkable. No pancreatic ductal dilatation or surrounding inflammatory changes. Spleen: Normal in size without significant abnormality. Adrenals/Urinary Tract: Significant interval reduction in size of a bulky left adrenal mass, now measuring 7.4 x 5.0 cm, previously 10.8 x 7.3 cm when measured similarly (series 2, image 69). A right adrenal nodule is however slightly enlarged, now measuring 2.8 x 2.2 cm, previously 2.3 x 1.6 cm when measured similarly (series 2, image 66). Punctuate nonobstructive left nephrolithiasis. No hydronephrosis. Bladder is unremarkable. Stomach/Bowel: Stomach is within normal limits. Appendix appears normal. No evidence of bowel wall thickening, distention, or inflammatory changes. Sigmoid diverticulosis. Vascular/Lymphatic: No significant vascular findings are present. No enlarged abdominal or pelvic lymph nodes. Reproductive: No mass or other abnormality. Other: No abdominal wall hernia or abnormality. No abdominopelvic ascites.  Small nodule in the left paracolic gutter measuring 1.1 cm is stable compared to immediate prior examination (not previously appreciated due to significant motion artifact in this vicinity) although new in comparison to examination dated 01/26/2018 (series 2, image 104). A previously noted peritoneal or omental nodule adjacent to hepatic flexure of the colon now measures approximately 6 mm, previously 1.2 cm (series 2, image 59). Musculoskeletal: No other acute or significant osseous findings. IMPRESSION: 1.  Evidence of mixed response of metastatic disease. 2. Significant interval decrease in size of bulky masses of the suprahilar right upper lobe, largest mass previously measuring 3.9 cm, now an irregular residual of soft tissue measuring approximately 1.7 x 0.7 cm (series 4, image 52). A larger adjacent nodule measures 1.8 x 1.5 cm, previously 3.0 x 1.9 cm when measured similarly. 3. Significant interval reduction in size of a bulky left adrenal mass, now measuring 7.4 x 5.0 cm, previously 10.8 x 7.3 cm when measured similarly (series 2, image 69). A right adrenal nodule is however slightly enlarged, now measuring 2.8 x 2.2 cm, previously 2.3 x 1.6 cm when measured similarly (series 2, image 66). 4. Small nodule in the left paracolic gutter measuring 1.1 cm is stable compared to immediate prior examination (not previously appreciated due to significant motion artifact in this vicinity) although new in comparison to examination dated 01/26/2018 (series 2, image 104). A previously noted peritoneal or omental nodule adjacent to hepatic flexure of the colon now measures approximately 6 mm, previously 1.2 cm (series 2, image 59). 5. Unchanged  sclerosis of the T6 vertebral body, which remains suspicious for metastatic disease. 6.  Other chronic and incidental findings as detailed above. Electronically Signed   By: Eddie Candle M.D.   On: 07/03/2018 21:51   Mr Jeri Cos And Wo Contrast  Result Date:  07/02/2018 CLINICAL DATA:  Metastatic disease with progression on CT. EXAM: MRI HEAD WITHOUT AND WITH CONTRAST TECHNIQUE: Multiplanar, multiecho pulse sequences of the brain and surrounding structures were obtained without and with intravenous contrast. CONTRAST:  10 mL Gadovist IV COMPARISON:  CT head 07/02/2018, MRI head 04/27/2018 FINDINGS: Brain: Image quality degraded by significant motion. Marked ventricular enlargement similar to the prior MRI. Marked dilatation of the lateral third and fourth ventricles suggesting communicating hydrocephalus. This could be due to leptomeningeal tumor although I do not see leptomeningeal enhancement. Large enhancing mass right frontal lobe has enlarged now measuring 3.9 x 4.2 cm. Progression of surrounding edema and mass-effect on the right frontal horn. Numerous enhancing lesions are seen on the prior MRI which are not well identified on today's study due to extensive motion. Right temporal lesion may have enlarged in the interval. Negative for hemorrhage. No acute infarct. Vascular: Normal arterial flow voids Skull and upper cervical spine: Negative Sinuses/Orbits: Negative Other: None IMPRESSION: Image quality degraded by significant motion. This significantly limits evaluation of known metastatic disease to brain. Large mass in the right frontal lobe shows significant interval enlargement since the prior MRI of 04/27/2018, now measuring 3.9 x 4.2 cm. Increased edema and mass-effect. Multiple additional enhancing lesions on the prior MRI are not well seen on today's study likely due to motion. Severe communicating hydrocephalus unchanged. While this could be due to leptomeningeal carcinomatosis and obstruction of CSF resorption, no MRI findings of leptomeningeal carcinomatosis are identified. Electronically Signed   By: Franchot Gallo M.D.   On: 07/02/2018 15:29   US Scrotum  Result Date: 07/02/2018 CLINICAL DATA:  63 year old male with history of left-sided testicular  swelling. EXAM: SCROTAL ULTRASOUND DOPPLER ULTRASOUND OF THE TESTICLES TECHNIQUE: Complete ultrasound examination of the testicles, epididymis, and other scrotal structures was performed. Color and spectral Doppler ultrasound were also utilized to evaluate blood flow to the testicles. COMPARISON:  No priors. FINDINGS: Right testicle Measurements: 4.2 x 2.5 x 3.1 cm. No mass or microlithiasis visualized. Left testicle Measurements: 4.2 x 3.3 x 3.5 cm. No mass or microlithiasis visualized. Right epididymis:  Normal in size and appearance. Left epididymis:  Normal in size and appearance. Hydrocele: Left-sided hydrocele with low-level internal echogenicity. Varicocele:  None visualized. Pulsed Doppler interrogation of both testes demonstrates normal low resistance arterial and venous waveforms bilaterally. IMPRESSION: 1. Moderate left-sided hydrocele. 2. Normal sonographic appearance of the testicles and epididymides bilaterally. Electronically Signed   By: Vinnie Langton M.D.   On: 07/02/2018 19:21   Ct Abdomen Pelvis W Contrast  Result Date: 07/03/2018 CLINICAL DATA:  Restaging neuroendocrine carcinoma, mets to lung, brain EXAM: CT CHEST, ABDOMEN, AND PELVIS WITH CONTRAST TECHNIQUE: Multidetector CT imaging of the chest, abdomen and pelvis was performed following the standard protocol during bolus administration of intravenous contrast. CONTRAST:  169mL OMNIPAQUE IOHEXOL 300 MG/ML SOLN, 68mL OMNIPAQUE IOHEXOL 300 MG/ML SOLN, additional oral enteric contrast COMPARISON:  CT chest abdomen pelvis, 05/11/2018 FINDINGS: CT CHEST FINDINGS Cardiovascular: Left coronary artery calcifications. Normal heart size. No pericardial effusion. Mediastinum/Nodes: No enlarged mediastinal, hilar, or axillary lymph nodes. Coarse calcifications in the anterior mediastinum. Thyroid gland, trachea, and esophagus demonstrate no significant findings. Lungs/Pleura: Significant interval decrease in size of  bulky masses of the suprahilar  right upper lobe, largest mass previously measuring 3.9 cm, now an irregular residual of soft tissue measuring approximately 1.7 x 0.7 cm (series 4, image 52). A larger adjacent nodule measures 1.8 x 1.5 cm, previously 3.0 x 1.9 cm when measured similarly. Redemonstrated postoperative findings of right lower lobe wedge resection. No pleural effusion or pneumothorax. Musculoskeletal: No chest wall mass. Unchanged sclerosis of the T6 vertebral body. CT ABDOMEN PELVIS FINDINGS Hepatobiliary: No solid liver abnormality is seen. No gallstones, gallbladder wall thickening, or biliary dilatation. Pancreas: Unremarkable. No pancreatic ductal dilatation or surrounding inflammatory changes. Spleen: Normal in size without significant abnormality. Adrenals/Urinary Tract: Significant interval reduction in size of a bulky left adrenal mass, now measuring 7.4 x 5.0 cm, previously 10.8 x 7.3 cm when measured similarly (series 2, image 69). A right adrenal nodule is however slightly enlarged, now measuring 2.8 x 2.2 cm, previously 2.3 x 1.6 cm when measured similarly (series 2, image 66). Punctuate nonobstructive left nephrolithiasis. No hydronephrosis. Bladder is unremarkable. Stomach/Bowel: Stomach is within normal limits. Appendix appears normal. No evidence of bowel wall thickening, distention, or inflammatory changes. Sigmoid diverticulosis. Vascular/Lymphatic: No significant vascular findings are present. No enlarged abdominal or pelvic lymph nodes. Reproductive: No mass or other abnormality. Other: No abdominal wall hernia or abnormality. No abdominopelvic ascites. Small nodule in the left paracolic gutter measuring 1.1 cm is stable compared to immediate prior examination (not previously appreciated due to significant motion artifact in this vicinity) although new in comparison to examination dated 01/26/2018 (series 2, image 104). A previously noted peritoneal or omental nodule adjacent to hepatic flexure of the colon now  measures approximately 6 mm, previously 1.2 cm (series 2, image 59). Musculoskeletal: No other acute or significant osseous findings. IMPRESSION: 1.  Evidence of mixed response of metastatic disease. 2. Significant interval decrease in size of bulky masses of the suprahilar right upper lobe, largest mass previously measuring 3.9 cm, now an irregular residual of soft tissue measuring approximately 1.7 x 0.7 cm (series 4, image 52). A larger adjacent nodule measures 1.8 x 1.5 cm, previously 3.0 x 1.9 cm when measured similarly. 3. Significant interval reduction in size of a bulky left adrenal mass, now measuring 7.4 x 5.0 cm, previously 10.8 x 7.3 cm when measured similarly (series 2, image 69). A right adrenal nodule is however slightly enlarged, now measuring 2.8 x 2.2 cm, previously 2.3 x 1.6 cm when measured similarly (series 2, image 66). 4. Small nodule in the left paracolic gutter measuring 1.1 cm is stable compared to immediate prior examination (not previously appreciated due to significant motion artifact in this vicinity) although new in comparison to examination dated 01/26/2018 (series 2, image 104). A previously noted peritoneal or omental nodule adjacent to hepatic flexure of the colon now measures approximately 6 mm, previously 1.2 cm (series 2, image 59). 5. Unchanged sclerosis of the T6 vertebral body, which remains suspicious for metastatic disease. 6.  Other chronic and incidental findings as detailed above. Electronically Signed   By: Eddie Candle M.D.   On: 07/03/2018 21:51    ASSESSMENT AND PLAN: 1.  Metastatic neuroendocrine carcinoma -currently receiving cisplatin 25 mg meter squared and Irinotecan 50 mg/m on day 1 and day 8 of every cycle.  Last dose of chemotherapy was given on 06/19/2018. 2.  Enlarging frontal brain metastasis - radiation necrosis versus true tumor progression 3.  Altered mental status secondary to #2; improved  -Continue dexamethasone 4 mg every 6 hours per  recommendations from radiation oncology.  Dose will be titrated as an outpatient. -The patient will need to follow-up with neuro oncology upon discharge. -Restaging CT scans reviewed with the patient. He has had an overall response to his treatment. There is a very slight enlargement of his right adrenal nodule. This will be followed on future scans.   -PT evaluation completed. SNF recommended upon discharge.  -Keep outpatient follow-up with medical oncology on 07/10/2018 for evaluation prior to his next cycle of chemotherapy.     LOS: 2 days   Mikey Bussing, DNP, AGPCNP-BC, AOCNP 07/04/18

## 2018-07-04 NOTE — Plan of Care (Signed)

## 2018-07-05 ENCOUNTER — Telehealth: Payer: Self-pay | Admitting: *Deleted

## 2018-07-05 DIAGNOSIS — R4020 Unspecified coma: Secondary | ICD-10-CM

## 2018-07-05 DIAGNOSIS — L89319 Pressure ulcer of right buttock, unspecified stage: Secondary | ICD-10-CM

## 2018-07-05 LAB — CBC
HCT: 40 % (ref 39.0–52.0)
Hemoglobin: 13.3 g/dL (ref 13.0–17.0)
MCH: 30.8 pg (ref 26.0–34.0)
MCHC: 33.3 g/dL (ref 30.0–36.0)
MCV: 92.6 fL (ref 80.0–100.0)
Platelets: 176 10*3/uL (ref 150–400)
RBC: 4.32 MIL/uL (ref 4.22–5.81)
RDW: 17.4 % — ABNORMAL HIGH (ref 11.5–15.5)
WBC: 5.9 10*3/uL (ref 4.0–10.5)
nRBC: 0 % (ref 0.0–0.2)

## 2018-07-05 LAB — MAGNESIUM: Magnesium: 2.2 mg/dL (ref 1.7–2.4)

## 2018-07-05 LAB — BASIC METABOLIC PANEL
Anion gap: 8 (ref 5–15)
BUN: 20 mg/dL (ref 8–23)
CO2: 26 mmol/L (ref 22–32)
Calcium: 9.1 mg/dL (ref 8.9–10.3)
Chloride: 99 mmol/L (ref 98–111)
Creatinine, Ser: 0.85 mg/dL (ref 0.61–1.24)
GFR calc Af Amer: >60 mL/min (ref >60)
GFR calc non Af Amer: >60 mL/min (ref >60)
Glucose, Bld: 127 mg/dL — ABNORMAL HIGH (ref 70–99)
Potassium: 5 mmol/L (ref 3.5–5.1)
Sodium: 133 mmol/L — ABNORMAL LOW (ref 135–145)

## 2018-07-05 MED ORDER — DEXAMETHASONE 4 MG PO TABS
4.0000 mg | ORAL_TABLET | Freq: Four times a day (QID) | ORAL | Status: DC
  Administered 2018-07-06 – 2018-07-08 (×9): 4 mg via ORAL
  Filled 2018-07-05 (×10): qty 1

## 2018-07-05 MED ORDER — DEXAMETHASONE 4 MG PO TABS: 4.0000 mg | ORAL_TABLET | Freq: Four times a day (QID) | ORAL | 0 refills | Status: AC

## 2018-07-05 MED ORDER — DEXAMETHASONE 4 MG PO TABS
4.0000 mg | ORAL_TABLET | Freq: Four times a day (QID) | ORAL | Status: DC
  Administered 2018-07-05: 4 mg via ORAL
  Filled 2018-07-05 (×4): qty 1

## 2018-07-05 MED FILL — DEXAMETHASONE 4 MG TABLET: 4 | 30 days supply | Qty: 120 | Fill #0

## 2018-07-05 NOTE — Discharge Summary (Addendum)
Physician Discharge Summary  Dwayne Massey JQZ:[id number] DOB: Jan 18, 1955 DOA: 07/02/2018  PCP: Patient, No Pcp Per  Admit date: 07/02/2018 Discharge date: 07/05/2018  Admitted From: Home Disposition: Home  Recommendations for Outpatient Follow-up:  1. Follow up with PCP in 1-2 weeks 2. Please obtain BMP/CBC in one week your next doctors visit.  3. Decadron 4mg  q6hrs, given 30-day supply 4. Follow-up outpatient with neuro oncology 5. Follow-up outpatient with oncology at cancer center   Discharge Condition: Stable CODE STATUS: Full code Diet recommendation: As tolerated  Brief/Interim Summary: 63 year old with history of lung cancer with brain metastases, neuroendocrine tumor presented to the hospital with worsening confusion and weakness.  MRI performed in the hospital showed large mass in the right frontal lobe which is enlarged since April.  Oncology team consulted. Case was discussed with Radiation Onc, who thinks its due to tumor necrosis vs tumor itself. Plan on referral to outpatient Neuro Onc, Dr Mickeal Skinner. In the meantime PT rec- SNF. Arrangements were made and patient will be discharged to skilled nursing facility.  Patient and family changed mind to go home with home health.  Arrangements to be made.   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 Cont Decadron 4 mg every 6 hours. Unsure about his prognosis but I have encouraged the patient to start thinking about goals of care and have discussion with his wife.  Patient and family both aware of this and will  start having this discussion.  Left testicular scrotal enlargement with moderate left-sided hydrocele -Supportive care.  No evidence of epididymitis or testicular torsion.  Continue to monitor.  Altered mental status and generalized weakness -Resolved  Generalized weakness-physical therapy recommends skilled nursing facility.  Arrangements to be made by Education officer, museum.  But later patient changed his mind therefore wants to go home with home health.  Stage II coccyx sacral pressure ulcer-supportive care.  Consultations:  Oncology  Radiation oncology  Subjective: No complaints, overall patient states he is feeling well.  Discharge Exam: Vitals:   07/04/18 2023 07/05/18 0429  BP: 111/80 116/77  Pulse: 66 63  Resp: 20 18  Temp: 97.9 F (36.6 C) 97.7 F (36.5 C)  SpO2: 100% 97%   Vitals:   07/04/18 0542 07/04/18 1500 07/04/18 2023 07/05/18 0429  BP: 113/82 113/80 111/80 116/77  Pulse: 63 62 66 63  Resp:  18 20 18   Temp: 98.3 F (36.8 C) 98.3 F (36.8 C) 97.9 F (36.6 C) 97.7 F (36.5 C)  TempSrc: Oral Oral Oral Oral  SpO2: 97% 97% 100% 97%  Weight:      Height:        General: Pt is alert, awake, not in acute distress Cardiovascular: RRR, S1/S2 +, no rubs, no gallops Respiratory: CTA bilaterally, no wheezing, no rhonchi Abdominal: Soft, NT, ND, bowel sounds + Extremities: no edema, no cyanosis  Stage II coccyx sacral pressure ulcer  Discharge Instructions  Discharge Instructions    Call MD for:  difficulty breathing, headache or visual disturbances   Complete by: As directed    Call MD for:  severe uncontrolled  pain   Complete by: As directed    Diet - low sodium heart healthy   Complete by: As directed    Increase activity slowly   Complete by: As directed      Allergies as of 07/05/2018   No Known Allergies     Medication List    STOP taking these medications   LORazepam 2 MG tablet Commonly known as: ATIVAN     TAKE these medications    dexamethasone 4 MG tablet Commonly known as: DECADRON Take 1 tablet (4 mg total) by mouth every 6 (six) hours for 30 days.   diphenoxylate-atropine 2.5-0.025 MG tablet Commonly known as: LOMOTIL Take 1 tablet by mouth 4 (four) times daily as needed for diarrhea or loose stools.   naproxen sodium 220 MG tablet Commonly known as: ALEVE Take 440 mg by mouth 2 (two) times daily as needed (pain).   prochlorperazine 10 MG tablet Commonly known as: COMPAZINE Take 1 tablet (10 mg total) by mouth every 6 (six) hours as needed for nausea or vomiting.      Follow-up Information    Vaslow, Acey Lav, MD. Schedule an appointment as soon as possible for a visit in 1 day(s).   Specialties: Psychiatry, Neurology, Oncology Contact information: [place] [address] [place]. Go in 1 week(s).          No Known Allergies  You were cared for by a hospitalist during your hospital stay. If you have any questions about your discharge medications or the care you received while you were in the hospital after you are discharged, you can call the unit and asked to speak with the hospitalist on call if the hospitalist that took care of you is not available. Once you are discharged, your primary care physician will handle any further medical issues. Please note that no refills for any discharge medications will be authorized once you are discharged, as it is imperative that you return to your primary care physician (or establish a relationship with a primary care physician if you do not have one) for your aftercare needs so that they can reassess your need for medications and monitor your lab values.   Procedures/Studies: Dg Chest 2 View  Result Date: 07/02/2018 CLINICAL DATA:  Weakness, history stage IV lung cancer EXAM: CHEST - 2 VIEW COMPARISON:  Portable exam 0808 hours compared to CT chest 05/11/2018 FINDINGS: Normal heart size, mediastinal  contours, and pulmonary vascularity. Mild chronic RIGHT basilar atelectasis. RIGHT suprahilar nodular density unchanged corresponding to known tumor. Lungs otherwise clear. No infiltrate, pleural effusion or pneumothorax. IMPRESSION: Known RIGHT upper lobe tumor and chronic RIGHT basilar atelectasis. No acute abnormalities. Electronically Signed   By: Lavonia Dana M.D.   On: 07/02/2018 08:15   Ct Head Wo Contrast  Result Date: 07/02/2018 CLINICAL DATA:  Patient with known metastatic lung carcinoma with increased confusion and altered mental status EXAM: CT HEAD WITHOUT CONTRAST TECHNIQUE: Contiguous axial images were obtained from the base of the skull through the vertex without intravenous contrast. COMPARISON:  Brain MRI April 27, 2018 FINDINGS: Brain: Diffuse enlargement of the ventricles is a stable finding. The sulci appear unremarkable. There is extensive vasogenic edema throughout much of the right frontal lobe which was present previously but appears slightly more extensive. No other vasogenic edema is evident. The known multiple masses throughout the brain documented on prior MR are not delineated focally on this noncontrast  enhanced study. There is no hemorrhage. There Is impression on the frontal horn the right lateral ventricle due to the edema, more extensive than on the previous study. No midline shift evident, however. No extra-axial fluid collections are appreciable. Vascular: No appreciable hyperdense vessel. There is mild calcification in each carotid siphon region. Skull: The bony calvarium appears intact. Sinuses/Orbits: There is mucosal thickening in several ethmoid air cells. Other visualized paranasal sinuses are clear. Orbits appear symmetric bilaterally. Other: Visualized mastoid air cells are clear. IMPRESSION: 1. There is extensive vasogenic edema throughout the right frontal lobe which shows a slight increase compared to prior MR from April 2020. There is more mass effect on the  frontal horn the right lateral ventricle compared to the previous study. The known mass in this area is not well delineated on this noncontrast enhanced study. 2. Diffuse ventricular enlargement remains. Suspect a degree of underlying normal pressure hydrocephalus. 3. Recent MR showed multiple masses throughout the brain which are not delineated on this noncontrast enhanced study. If further evaluation of these masses and direct comparison of these masses compared to the previous study is felt to be warranted, would advise MR pre and post-contrast to further evaluate. 4. No acute infarct is demonstrable. No hemorrhage. No midline shift. 5.  Mild mucosal thickening noted in several ethmoid air cells. Electronically Signed   By: Lowella Grip III M.D.   On: 07/02/2018 08:57   Ct Chest W Contrast  Result Date: 07/03/2018 CLINICAL DATA:  Restaging neuroendocrine carcinoma, mets to lung, brain EXAM: CT CHEST, ABDOMEN, AND PELVIS WITH CONTRAST TECHNIQUE: Multidetector CT imaging of the chest, abdomen and pelvis was performed following the standard protocol during bolus administration of intravenous contrast. CONTRAST:  133mL OMNIPAQUE IOHEXOL 300 MG/ML SOLN, 7mL OMNIPAQUE IOHEXOL 300 MG/ML SOLN, additional oral enteric contrast COMPARISON:  CT chest abdomen pelvis, 05/11/2018 FINDINGS: CT CHEST FINDINGS Cardiovascular: Left coronary artery calcifications. Normal heart size. No pericardial effusion. Mediastinum/Nodes: No enlarged mediastinal, hilar, or axillary lymph nodes. Coarse calcifications in the anterior mediastinum. Thyroid gland, trachea, and esophagus demonstrate no significant findings. Lungs/Pleura: Significant interval decrease in size of bulky masses of the suprahilar right upper lobe, largest mass previously measuring 3.9 cm, now an irregular residual of soft tissue measuring approximately 1.7 x 0.7 cm (series 4, image 52). A larger adjacent nodule measures 1.8 x 1.5 cm, previously 3.0 x 1.9 cm when  measured similarly. Redemonstrated postoperative findings of right lower lobe wedge resection. No pleural effusion or pneumothorax. Musculoskeletal: No chest wall mass. Unchanged sclerosis of the T6 vertebral body. CT ABDOMEN PELVIS FINDINGS Hepatobiliary: No solid liver abnormality is seen. No gallstones, gallbladder wall thickening, or biliary dilatation. Pancreas: Unremarkable. No pancreatic ductal dilatation or surrounding inflammatory changes. Spleen: Normal in size without significant abnormality. Adrenals/Urinary Tract: Significant interval reduction in size of a bulky left adrenal mass, now measuring 7.4 x 5.0 cm, previously 10.8 x 7.3 cm when measured similarly (series 2, image 69). A right adrenal nodule is however slightly enlarged, now measuring 2.8 x 2.2 cm, previously 2.3 x 1.6 cm when measured similarly (series 2, image 66). Punctuate nonobstructive left nephrolithiasis. No hydronephrosis. Bladder is unremarkable. Stomach/Bowel: Stomach is within normal limits. Appendix appears normal. No evidence of bowel wall thickening, distention, or inflammatory changes. Sigmoid diverticulosis. Vascular/Lymphatic: No significant vascular findings are present. No enlarged abdominal or pelvic lymph nodes. Reproductive: No mass or other abnormality. Other: No abdominal wall hernia or abnormality. No abdominopelvic ascites. Small nodule in the left paracolic gutter measuring  1.1 cm is stable compared to immediate prior examination (not previously appreciated due to significant motion artifact in this vicinity) although new in comparison to examination dated 01/26/2018 (series 2, image 104). A previously noted peritoneal or omental nodule adjacent to hepatic flexure of the colon now measures approximately 6 mm, previously 1.2 cm (series 2, image 59). Musculoskeletal: No other acute or significant osseous findings. IMPRESSION: 1.  Evidence of mixed response of metastatic disease. 2. Significant interval decrease in  size of bulky masses of the suprahilar right upper lobe, largest mass previously measuring 3.9 cm, now an irregular residual of soft tissue measuring approximately 1.7 x 0.7 cm (series 4, image 52). A larger adjacent nodule measures 1.8 x 1.5 cm, previously 3.0 x 1.9 cm when measured similarly. 3. Significant interval reduction in size of a bulky left adrenal mass, now measuring 7.4 x 5.0 cm, previously 10.8 x 7.3 cm when measured similarly (series 2, image 69). A right adrenal nodule is however slightly enlarged, now measuring 2.8 x 2.2 cm, previously 2.3 x 1.6 cm when measured similarly (series 2, image 66). 4. Small nodule in the left paracolic gutter measuring 1.1 cm is stable compared to immediate prior examination (not previously appreciated due to significant motion artifact in this vicinity) although new in comparison to examination dated 01/26/2018 (series 2, image 104). A previously noted peritoneal or omental nodule adjacent to hepatic flexure of the colon now measures approximately 6 mm, previously 1.2 cm (series 2, image 59). 5. Unchanged sclerosis of the T6 vertebral body, which remains suspicious for metastatic disease. 6.  Other chronic and incidental findings as detailed above. Electronically Signed   By: Eddie Candle M.D.   On: 07/03/2018 21:51   Mr Jeri Cos And Wo Contrast  Result Date: 07/02/2018 CLINICAL DATA:  Metastatic disease with progression on CT. EXAM: MRI HEAD WITHOUT AND WITH CONTRAST TECHNIQUE: Multiplanar, multiecho pulse sequences of the brain and surrounding structures were obtained without and with intravenous contrast. CONTRAST:  10 mL Gadovist IV COMPARISON:  CT head 07/02/2018, MRI head 04/27/2018 FINDINGS: Brain: Image quality degraded by significant motion. Marked ventricular enlargement similar to the prior MRI. Marked dilatation of the lateral third and fourth ventricles suggesting communicating hydrocephalus. This could be due to leptomeningeal tumor although I do not  see leptomeningeal enhancement. Large enhancing mass right frontal lobe has enlarged now measuring 3.9 x 4.2 cm. Progression of surrounding edema and mass-effect on the right frontal horn. Numerous enhancing lesions are seen on the prior MRI which are not well identified on today's study due to extensive motion. Right temporal lesion may have enlarged in the interval. Negative for hemorrhage. No acute infarct. Vascular: Normal arterial flow voids Skull and upper cervical spine: Negative Sinuses/Orbits: Negative Other: None IMPRESSION: Image quality degraded by significant motion. This significantly limits evaluation of known metastatic disease to brain. Large mass in the right frontal lobe shows significant interval enlargement since the prior MRI of 04/27/2018, now measuring 3.9 x 4.2 cm. Increased edema and mass-effect. Multiple additional enhancing lesions on the prior MRI are not well seen on today's study likely due to motion. Severe communicating hydrocephalus unchanged. While this could be due to leptomeningeal carcinomatosis and obstruction of CSF resorption, no MRI findings of leptomeningeal carcinomatosis are identified. Electronically Signed   By: Franchot Gallo M.D.   On: 07/02/2018 15:29   US Scrotum  Result Date: 07/02/2018 CLINICAL DATA:  63 year old male with history of left-sided testicular swelling. EXAM: SCROTAL ULTRASOUND DOPPLER ULTRASOUND OF THE  TESTICLES TECHNIQUE: Complete ultrasound examination of the testicles, epididymis, and other scrotal structures was performed. Color and spectral Doppler ultrasound were also utilized to evaluate blood flow to the testicles. COMPARISON:  No priors. FINDINGS: Right testicle Measurements: 4.2 x 2.5 x 3.1 cm. No mass or microlithiasis visualized. Left testicle Measurements: 4.2 x 3.3 x 3.5 cm. No mass or microlithiasis visualized. Right epididymis:  Normal in size and appearance. Left epididymis:  Normal in size and appearance. Hydrocele: Left-sided  hydrocele with low-level internal echogenicity. Varicocele:  None visualized. Pulsed Doppler interrogation of both testes demonstrates normal low resistance arterial and venous waveforms bilaterally. IMPRESSION: 1. Moderate left-sided hydrocele. 2. Normal sonographic appearance of the testicles and epididymides bilaterally. Electronically Signed   By: Vinnie Langton M.D.   On: 07/02/2018 19:21   Ct Abdomen Pelvis W Contrast  Result Date: 07/03/2018 CLINICAL DATA:  Restaging neuroendocrine carcinoma, mets to lung, brain EXAM: CT CHEST, ABDOMEN, AND PELVIS WITH CONTRAST TECHNIQUE: Multidetector CT imaging of the chest, abdomen and pelvis was performed following the standard protocol during bolus administration of intravenous contrast. CONTRAST:  128mL OMNIPAQUE IOHEXOL 300 MG/ML SOLN, 74mL OMNIPAQUE IOHEXOL 300 MG/ML SOLN, additional oral enteric contrast COMPARISON:  CT chest abdomen pelvis, 05/11/2018 FINDINGS: CT CHEST FINDINGS Cardiovascular: Left coronary artery calcifications. Normal heart size. No pericardial effusion. Mediastinum/Nodes: No enlarged mediastinal, hilar, or axillary lymph nodes. Coarse calcifications in the anterior mediastinum. Thyroid gland, trachea, and esophagus demonstrate no significant findings. Lungs/Pleura: Significant interval decrease in size of bulky masses of the suprahilar right upper lobe, largest mass previously measuring 3.9 cm, now an irregular residual of soft tissue measuring approximately 1.7 x 0.7 cm (series 4, image 52). A larger adjacent nodule measures 1.8 x 1.5 cm, previously 3.0 x 1.9 cm when measured similarly. Redemonstrated postoperative findings of right lower lobe wedge resection. No pleural effusion or pneumothorax. Musculoskeletal: No chest wall mass. Unchanged sclerosis of the T6 vertebral body. CT ABDOMEN PELVIS FINDINGS Hepatobiliary: No solid liver abnormality is seen. No gallstones, gallbladder wall thickening, or biliary dilatation. Pancreas:  Unremarkable. No pancreatic ductal dilatation or surrounding inflammatory changes. Spleen: Normal in size without significant abnormality. Adrenals/Urinary Tract: Significant interval reduction in size of a bulky left adrenal mass, now measuring 7.4 x 5.0 cm, previously 10.8 x 7.3 cm when measured similarly (series 2, image 69). A right adrenal nodule is however slightly enlarged, now measuring 2.8 x 2.2 cm, previously 2.3 x 1.6 cm when measured similarly (series 2, image 66). Punctuate nonobstructive left nephrolithiasis. No hydronephrosis. Bladder is unremarkable. Stomach/Bowel: Stomach is within normal limits. Appendix appears normal. No evidence of bowel wall thickening, distention, or inflammatory changes. Sigmoid diverticulosis. Vascular/Lymphatic: No significant vascular findings are present. No enlarged abdominal or pelvic lymph nodes. Reproductive: No mass or other abnormality. Other: No abdominal wall hernia or abnormality. No abdominopelvic ascites. Small nodule in the left paracolic gutter measuring 1.1 cm is stable compared to immediate prior examination (not previously appreciated due to significant motion artifact in this vicinity) although new in comparison to examination dated 01/26/2018 (series 2, image 104). A previously noted peritoneal or omental nodule adjacent to hepatic flexure of the colon now measures approximately 6 mm, previously 1.2 cm (series 2, image 59). Musculoskeletal: No other acute or significant osseous findings. IMPRESSION: 1.  Evidence of mixed response of metastatic disease. 2. Significant interval decrease in size of bulky masses of the suprahilar right upper lobe, largest mass previously measuring 3.9 cm, now an irregular residual of soft tissue measuring approximately 1.7  x 0.7 cm (series 4, image 52). A larger adjacent nodule measures 1.8 x 1.5 cm, previously 3.0 x 1.9 cm when measured similarly. 3. Significant interval reduction in size of a bulky left adrenal mass, now  measuring 7.4 x 5.0 cm, previously 10.8 x 7.3 cm when measured similarly (series 2, image 69). A right adrenal nodule is however slightly enlarged, now measuring 2.8 x 2.2 cm, previously 2.3 x 1.6 cm when measured similarly (series 2, image 66). 4. Small nodule in the left paracolic gutter measuring 1.1 cm is stable compared to immediate prior examination (not previously appreciated due to significant motion artifact in this vicinity) although new in comparison to examination dated 01/26/2018 (series 2, image 104). A previously noted peritoneal or omental nodule adjacent to hepatic flexure of the colon now measures approximately 6 mm, previously 1.2 cm (series 2, image 59). 5. Unchanged sclerosis of the T6 vertebral body, which remains suspicious for metastatic disease. 6.  Other chronic and incidental findings as detailed above. Electronically Signed   By: Eddie Candle M.D.   On: 07/03/2018 21:51      The results of significant diagnostics from this hospitalization (including imaging, microbiology, ancillary and laboratory) are listed below for reference.     Microbiology: Recent Results (from the past 240 hour(s))  Urine culture     Status: None   Collection Time: 07/02/18  7:41 AM   Specimen: Urine, Random  Result Value Ref Range Status   Specimen Description   Final    URINE, RANDOM Performed at [facility] [address] [place], [place] [address]    Special Requests   Final    NONE Performed at [facility], [place] [address], [place], [place] [address]    Culture   Final    NO GROWTH Performed at [facility], [place] [address] [place], [place] [address]    Report Status 07/03/2018 FINAL  Final  SARS Coronavirus 2 (CEPHEID - Performed in [place] hospital lab), Hosp Order     Status: None   Collection Time: 07/02/18  9:16 AM   Specimen: Nasopharyngeal Swab  Result Value Ref Range Status   SARS Coronavirus 2 NEGATIVE NEGATIVE Final     Comment: (NOTE) If result is NEGATIVE SARS-CoV-2 target nucleic acids are NOT DETECTED. The SARS-CoV-2 RNA is generally detectable in upper and lower  respiratory specimens during the acute phase of infection. The lowest  concentration of SARS-CoV-2 viral copies this assay can detect is 250  copies / mL. A negative result does not preclude SARS-CoV-2 infection  and should not be used as the sole basis for treatment or other  patient management decisions.  A negative result may occur with  improper specimen collection / handling, submission of specimen other  than nasopharyngeal swab, presence of viral mutation(s) within the  areas targeted by this assay, and inadequate number of viral copies  (<250 copies / mL). A negative result must be combined with clinical  observations, patient history, and epidemiological information. If result is POSITIVE SARS-CoV-2 target nucleic acids are DETECTED. The SARS-CoV-2 RNA is generally detectable in upper and lower  respiratory specimens dur ing the acute phase of infection.  Positive  results are indicative of active infection with SARS-CoV-2.  Clinical  correlation with patient history and other diagnostic information is  necessary to determine patient infection status.  Positive results do  not rule out bacterial infection or co-infection with other viruses. If result is PRESUMPTIVE POSTIVE  SARS-CoV-2 nucleic acids MAY BE PRESENT.   A presumptive positive result was obtained on the submitted specimen  and confirmed on repeat testing.  While 2019 novel coronavirus  (SARS-CoV-2) nucleic acids may be present in the submitted sample  additional confirmatory testing may be necessary for epidemiological  and / or clinical management purposes  to differentiate between  SARS-CoV-2 and other Sarbecovirus currently known to infect humans.  If clinically indicated additional testing with an alternate test  methodology [phone]) is advised. The SARS-CoV-2  RNA is generally  detectable in upper and lower respiratory sp ecimens during the acute  phase of infection. The expected result is Negative. Fact Sheet for Patients:  StrictlyIdeas.no Fact Sheet for Healthcare Providers: BankingDealers.co.za This test is not yet approved or cleared by the [place] FDA and has been authorized for detection and/or diagnosis of SARS-CoV-2 by FDA under an Emergency Use Authorization (EUA).  This EUA will remain in effect (meaning this test can be used) for the duration of the COVID-19 declaration under Section 564(b)(1) of the Act, 21 U.S.C. section 360bbb-3(b)(1), unless the authorization is terminated or revoked sooner. Performed at [facility], [place] [address] [place], [place] [address]      Labs: BNP (last 3 results) No results for input(s): BNP in the last 8760 hours. Basic Metabolic Panel: Recent Labs  Lab 07/02/18 0746 07/04/18 0433 07/05/18 0439  NA 133* 133* 133*  K 3.8 4.2 5.0  CL 97* 97* 99  CO2 23 24 26   GLUCOSE 110* 132* 127*  BUN 12 19 20   CREATININE 0.92 0.86 0.85  CALCIUM 8.8* 9.2 9.1  MG 1.7 2.2 2.2   Liver Function Tests: Recent Labs  Lab 07/02/18 0746  AST 24  ALT 18  ALKPHOS 85  BILITOT 1.0  PROT 6.3*  ALBUMIN 3.6   Recent Labs  Lab 07/02/18 0746  LIPASE 24   No results for input(s): AMMONIA in the last 168 hours. CBC: Recent Labs  Lab 07/02/18 0746 07/04/18 0433 07/05/18 0439  WBC 5.9 7.4 5.9  NEUTROABS 5.1  --   --   HGB 12.9* 13.6 13.3  HCT 39.1 42.0 40.0  MCV 91.6 92.3 92.6  PLT 172 187 176   Cardiac Enzymes: Recent Labs  Lab 07/02/18 0746  CKTOTAL 299  TROPONINI <0.03   BNP: Invalid input(s): POCBNP CBG: No results for input(s): GLUCAP in the last 168 hours. D-Dimer No results for input(s): DDIMER in the last 72 hours. Hgb A1c No results for input(s): HGBA1C in the last 72 hours. Lipid Profile No results for  input(s): CHOL, HDL, LDLCALC, TRIG, CHOLHDL, LDLDIRECT in the last 72 hours. Thyroid function studies Recent Labs    07/03/18 0544  TSH 0.528   Anemia work up No results for input(s): VITAMINB12, FOLATE, FERRITIN, TIBC, IRON, RETICCTPCT in the last 72 hours. Urinalysis    Component Value Date/Time   COLORURINE AMBER (A) 07/02/2018 0746   APPEARANCEUR CLEAR 07/02/2018 0746   LABSPEC 1.027 07/02/2018 0746   PHURINE 5.0 07/02/2018 0746   GLUCOSEU NEGATIVE 07/02/2018 0746   HGBUR NEGATIVE 07/02/2018 0746   BILIRUBINUR NEGATIVE 07/02/2018 0746   KETONESUR 20 (A) 07/02/2018 0746   PROTEINUR NEGATIVE 07/02/2018 0746   NITRITE NEGATIVE 07/02/2018 0746   LEUKOCYTESUR NEGATIVE 07/02/2018 0746   Sepsis Labs Invalid input(s): PROCALCITONIN,  WBC,  LACTICIDVEN Microbiology Recent Results (from the past 240 hour(s))  Urine culture     Status: None   Collection Time: 07/02/18  7:41 AM  Specimen: Urine, Random  Result Value Ref Range Status   Specimen Description   Final    URINE, RANDOM Performed at [facility] [address], [place], [place] [address]    Special Requests   Final    NONE Performed at [facility], [place] [address], [place], [place] [address]    Culture   Final    NO GROWTH Performed at [facility], [place] [address], [place],  [address]    Report Status 07/03/2018 FINAL  Final  SARS Coronavirus 2 (CEPHEID - Performed in [facility] hospital lab), Hosp Order     Status: None   Collection Time: 07/02/18  9:16 AM   Specimen: Nasopharyngeal Swab  Result Value Ref Range Status   SARS Coronavirus 2 NEGATIVE NEGATIVE Final    Comment: (NOTE) If result is NEGATIVE SARS-CoV-2 target nucleic acids are NOT DETECTED. The SARS-CoV-2 RNA is generally detectable in upper and lower  respiratory specimens during the acute phase of infection. The lowest  concentration of SARS-CoV-2 viral copies this assay can detect is 250   copies / mL. A negative result does not preclude SARS-CoV-2 infection  and should not be used as the sole basis for treatment or other  patient management decisions.  A negative result may occur with  improper specimen collection / handling, submission of specimen other  than nasopharyngeal swab, presence of viral mutation(s) within the  areas targeted by this assay, and inadequate number of viral copies  (<250 copies / mL). A negative result must be combined with clinical  observations, patient history, and epidemiological information. If result is POSITIVE SARS-CoV-2 target nucleic acids are DETECTED. The SARS-CoV-2 RNA is generally detectable in upper and lower  respiratory specimens dur ing the acute phase of infection.  Positive  results are indicative of active infection with SARS-CoV-2.  Clinical  correlation with patient history and other diagnostic information is  necessary to determine patient infection status.  Positive results do  not rule out bacterial infection or co-infection with other viruses. If result is PRESUMPTIVE POSTIVE SARS-CoV-2 nucleic acids MAY BE PRESENT.   A presumptive positive result was obtained on the submitted specimen  and confirmed on repeat testing.  While 2019 novel coronavirus  (SARS-CoV-2) nucleic acids may be present in the submitted sample  additional confirmatory testing may be necessary for epidemiological  and / or clinical management purposes  to differentiate between  SARS-CoV-2 and other Sarbecovirus currently known to infect humans.  If clinically indicated additional testing with an alternate test  methodology [phone]) is advised. The SARS-CoV-2 RNA is generally  detectable in upper and lower respiratory sp ecimens during the acute  phase of infection. The expected result is Negative. Fact Sheet for Patients:  StrictlyIdeas.no Fact Sheet for Healthcare  Providers: BankingDealers.co.za This test is not yet approved or cleared by the [place] FDA and has been authorized for detection and/or diagnosis of SARS-CoV-2 by FDA under an Emergency Use Authorization (EUA).  This EUA will remain in effect (meaning this test can be used) for the duration of the COVID-19 declaration under Section 564(b)(1) of the Act, 21 U.S.C. section 360bbb-3(b)(1), unless the authorization is terminated or revoked sooner. Performed at [facility], [place] [address], [place],  [address]      Time coordinating discharge:  I have spent 35 minutes face to face with the patient and on the ward discussing the patients care, assessment, plan and disposition with other care  givers. >50% of the time was devoted counseling the patient about the risks and benefits of treatment/Discharge disposition and coordinating care.   SIGNED:   Damita Lack, MD  [facility] 07/05/2018, 11:33 AM   If 7PM-7AM, please contact night-coverage [email or web address]

## 2018-07-05 NOTE — TOC Progression Note (Signed)
Transition of Care [facility]) - Progression Note    Patient Details  Name: Dwayne Massey MRN: [id number] Date of Birth: [birth year]  Transition of Care [facility]) CM/SW [place], LCSW Phone Number: 07/05/2018, 12:24 PM  Clinical Narrative:   CSW following patient for support and discharge needs. Patient is not service connected through the [place] ([facility] in [place], all of patients providers through the [place] are still in the [place] area. CSW spoke with [facility] representative Adaku Filiberto Pinks [phone] (ext [phone]) and she stated they can reach out to patient wife and start the process of getting patient service connected. Adaku stated they can get patient a PCP in the [place] in [place] but it will take time for patient to get connected. CSW was giving permission by patients wife to use her insurance (patient is also under wife's insurance) to set him up with [facility] agency in the area. CSW has been in contact with several agency in the area to try and set patient up with  Home health at home.    Expected Discharge Plan: [place] Barriers to Discharge: Continued Medical Work up  Expected Discharge Plan and Services Expected Discharge Plan: [place] In-house Referral: Clinical Social Work   Post Acute Care Choice: [place] arrangements for the past 2 months: Single Family Home Expected Discharge Date: 07/05/18                                     Social Determinants of Health (SDOH) Interventions    Readmission Risk Interventions No flowsheet data found.

## 2018-07-05 NOTE — Progress Notes (Signed)
Report received from B. Helene Kelp, Therapist, sports. No change from the initial pm assessment. Will continue to monitor and follow the POC.

## 2018-07-05 NOTE — TOC Progression Note (Signed)
Transition of Care [facility]) - Progression Note    Patient Details  Name: Dwayne Massey MRN: [id number] Date of Birth: [birth year]  Transition of Care [facility]) CM/SW [place], LCSW Phone Number: 07/05/2018, 4:27 PM  Clinical Narrative:   Patient at this time does not have a home health agency set up. CSW is still trying to work with patients insurance to find resources for patient     Expected Discharge Plan: [place] Barriers to Discharge: Continued Medical Work up  Expected Discharge Plan and Services Expected Discharge Plan: [place] In-house Referral: Clinical Social Work   Post Acute Care Choice: [place] arrangements for the past 2 months: Single Family Home Expected Discharge Date: 07/05/18                                     Social Determinants of Health (SDOH) Interventions    Readmission Risk Interventions No flowsheet data found.

## 2018-07-05 NOTE — Telephone Encounter (Signed)
Patient needs to be seen by Dr Mickeal Skinner within 2 week window of being discharged from hospital.  Called wife [name] and left message to return call.  Pending call back to schedule.

## 2018-07-06 LAB — CBC
HCT: 39.9 % (ref 39.0–52.0)
Hemoglobin: 13.5 g/dL (ref 13.0–17.0)
MCH: 30.7 pg (ref 26.0–34.0)
MCHC: 33.8 g/dL (ref 30.0–36.0)
MCV: 90.7 fL (ref 80.0–100.0)
Platelets: 166 10*3/uL (ref 150–400)
RBC: 4.4 MIL/uL (ref 4.22–5.81)
RDW: 17 % — ABNORMAL HIGH (ref 11.5–15.5)
WBC: 4.7 10*3/uL (ref 4.0–10.5)
nRBC: 0 % (ref 0.0–0.2)

## 2018-07-06 LAB — BASIC METABOLIC PANEL
Anion gap: 10 (ref 5–15)
BUN: 21 mg/dL (ref 8–23)
CO2: 22 mmol/L (ref 22–32)
Calcium: 8.8 mg/dL — ABNORMAL LOW (ref 8.9–10.3)
Chloride: 100 mmol/L (ref 98–111)
Creatinine, Ser: 0.76 mg/dL (ref 0.61–1.24)
GFR calc Af Amer: >60 mL/min (ref >60)
GFR calc non Af Amer: >60 mL/min (ref >60)
Glucose, Bld: 120 mg/dL — ABNORMAL HIGH (ref 70–99)
Potassium: 4.3 mmol/L (ref 3.5–5.1)
Sodium: 132 mmol/L — ABNORMAL LOW (ref 135–145)

## 2018-07-06 LAB — MAGNESIUM: Magnesium: 2.1 mg/dL (ref 1.7–2.4)

## 2018-07-06 NOTE — Progress Notes (Signed)
Patient seen and examined at bedside.  No change.  Vital signs are stable.  Case manager currently working on acquiring home health services but difficulty finding resources given his insurance.  Patient does have any complaints.  He is aware of this plan.  Call with questions as needed. Medically stable to be discharged.  Refer to discharge summary from 07/05/2018.  Gerlean Ren MD

## 2018-07-06 NOTE — Progress Notes (Signed)
Physical Therapy Treatment Patient Details Name: Dwayne Massey MRN: [id number] DOB: 01-19-1955 Today's Date: 07/06/2018    History of Present Illness Pt is a 63 year old male admitted from home secondary to confusion, generalized weakness, and unsteady gait.  MRI of the brain was performed which showed a large mass in the right frontal lobe with significant interval enlargement since the prior MRI in April 2020.  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  Pt requiring at least mod assist for mobility at this time.  Pt has posterior bias with standing activity and presents as high fall risk. Per chart, plan is for home.  Continue to recommend SNF upon d/c.   Follow Up Recommendations  SNF     Equipment Recommendations  None recommended by PT    Recommendations for Other Services       Precautions / Restrictions Precautions Precautions: Fall    Mobility  Bed Mobility Overal bed mobility: Needs Assistance Bed Mobility: Supine to Sit     Supine to sit: Min guard;HOB elevated     General bed mobility comments: verbal cues for technique  Transfers Overall transfer level: Needs assistance Equipment used: Rolling walker (2 wheeled) Transfers: Sit to/from Stand Sit to Stand: Mod assist;+2 safety/equipment;+2 physical assistance         General transfer comment: verbal cues for hand placement and technique; posterior bias with standing, able to correct however only briefly, assist for controlling descent  Ambulation/Gait Ambulation/Gait assistance: Mod assist;+2 safety/equipment Gait Distance (Feet): 9 Feet Assistive device: Rolling walker (2 wheeled) Gait Pattern/deviations: Step-through pattern;Decreased stride length;Wide base of support     General Gait Details: verbal cues for safe technique, constant assist for steadying, pt  with posterior bias, weight bearing more on heels so encouraged forefoot/toes contact/weight shift, distance limited by fatigue   Stairs             Wheelchair Mobility    Modified Rankin (Stroke Patients Only)       Balance Overall balance assessment: History of Falls;Needs assistance         Standing balance support: Bilateral upper extremity supported Standing balance-Leahy Scale: Poor Standing balance comment: requiring UE support and external assist                            Cognition Arousal/Alertness: Awake/alert Behavior During Therapy: WFL for tasks assessed/performed Overall Cognitive Status: Within Functional Limits for tasks assessed                                 General Comments: more appropriate today however poor safety awareness      Exercises      General Comments        Pertinent Vitals/Pain Pain Assessment: No/denies pain    Home Living                      Prior Function            PT Goals (current goals can now be found in the care plan section) Progress towards PT goals: Progressing toward goals    Frequency    Min 2X/week      PT Plan Current plan remains appropriate    Co-evaluation  AM-PAC PT "6 Clicks" Mobility   Outcome Measure  Help needed turning from your back to your side while in a flat bed without using bedrails?: A Lot Help needed moving from lying on your back to sitting on the side of a flat bed without using bedrails?: A Lot Help needed moving to and from a bed to a chair (including a wheelchair)?: A Lot Help needed standing up from a chair using your arms (e.g., wheelchair or bedside chair)?: A Lot Help needed to walk in hospital room?: A Lot Help needed climbing 3-5 steps with a railing? : Total 6 Click Score: 11    End of Session Equipment Utilized During Treatment: Gait belt Activity Tolerance: Patient tolerated treatment well Patient  left: in chair;with chair alarm set;with call bell/phone within reach Nurse Communication: Mobility status PT Visit Diagnosis: Other abnormalities of gait and mobility (R26.89);Unsteadiness on feet (R26.81)     Time: 1001-1016 PT Time Calculation (min) (ACUTE ONLY): 15 min  Charges:  $Gait Training: 8-22 mins                    Carmelia Bake, PT, DPT Acute Rehabilitation Services Office: [phone] Pager: [phone]  Trena Platt 07/06/2018, 1:01 PM

## 2018-07-06 NOTE — TOC Progression Note (Signed)
Transition of Care [facility]) - Progression Note    Patient Details  Name: Dwayne Massey MRN: [id number] Date of Birth: 08-Sep-1955  [facility]) CM/SW [place], LCSW Phone Number: 07/06/2018, 12:42 PM  Clinical Narrative:   CSW and Team Lead spoke with patients [facility] representative. VA stated that patient has an appointment set up for Monday 6/29 for 1pm. VA stated once he is set up with his PCP they will be able to set up [facility] needs in the home. MD aware and discharging pt Sunday    Expected Discharge Plan: [place] Barriers to Discharge: Continued Medical Work up  Expected Discharge Plan and Services Expected Discharge Plan: [place] In-house Referral: Clinical Social Work   Post Acute Care Choice: [place] arrangements for the past 2 months: Single Family Home Expected Discharge Date: 07/06/18                                     Social Determinants of Health (SDOH) Interventions    Readmission Risk Interventions No flowsheet data found.

## 2018-07-07 LAB — BASIC METABOLIC PANEL
Anion gap: 8 (ref 5–15)
BUN: 22 mg/dL (ref 8–23)
CO2: 28 mmol/L (ref 22–32)
Calcium: 9 mg/dL (ref 8.9–10.3)
Chloride: 96 mmol/L — ABNORMAL LOW (ref 98–111)
Creatinine, Ser: 0.93 mg/dL (ref 0.61–1.24)
GFR calc Af Amer: >60 mL/min (ref >60)
GFR calc non Af Amer: >60 mL/min (ref >60)
Glucose, Bld: 123 mg/dL — ABNORMAL HIGH (ref 70–99)
Potassium: 4.2 mmol/L (ref 3.5–5.1)
Sodium: 132 mmol/L — ABNORMAL LOW (ref 135–145)

## 2018-07-07 LAB — CBC
HCT: 41.8 % (ref 39.0–52.0)
Hemoglobin: 13.8 g/dL (ref 13.0–17.0)
MCH: 30.5 pg (ref 26.0–34.0)
MCHC: 33 g/dL (ref 30.0–36.0)
MCV: 92.3 fL (ref 80.0–100.0)
Platelets: 202 10*3/uL (ref 150–400)
RBC: 4.53 MIL/uL (ref 4.22–5.81)
RDW: 17.3 % — ABNORMAL HIGH (ref 11.5–15.5)
WBC: 3.5 10*3/uL — ABNORMAL LOW (ref 4.0–10.5)
nRBC: 0 % (ref 0.0–0.2)

## 2018-07-07 LAB — MAGNESIUM: Magnesium: 2.2 mg/dL (ref 1.7–2.4)

## 2018-07-07 NOTE — Progress Notes (Signed)
Patient seen and examined at bedside.  No complaints.  Unable to discharge patient until tomorrow.  Has primary care physician appointment on Monday.  Refer to case manager note for details.  Chart has been reviewed, vital signs have been reviewed.  Remained stable.  Continue management as planned.  Discharge summary dictated on 07/05/2018.  Dwayne Ren MD [facility]

## 2018-07-08 LAB — BASIC METABOLIC PANEL
Anion gap: 6 (ref 5–15)
BUN: 22 mg/dL (ref 8–23)
CO2: 28 mmol/L (ref 22–32)
Calcium: 8.8 mg/dL — ABNORMAL LOW (ref 8.9–10.3)
Chloride: 97 mmol/L — ABNORMAL LOW (ref 98–111)
Creatinine, Ser: 0.97 mg/dL (ref 0.61–1.24)
GFR calc Af Amer: >60 mL/min (ref >60)
GFR calc non Af Amer: >60 mL/min (ref >60)
Glucose, Bld: 127 mg/dL — ABNORMAL HIGH (ref 70–99)
Potassium: 4.4 mmol/L (ref 3.5–5.1)
Sodium: 131 mmol/L — ABNORMAL LOW (ref 135–145)

## 2018-07-08 LAB — CBC
HCT: 41.8 % (ref 39.0–52.0)
Hemoglobin: 13.7 g/dL (ref 13.0–17.0)
MCH: 30.2 pg (ref 26.0–34.0)
MCHC: 32.8 g/dL (ref 30.0–36.0)
MCV: 92.1 fL (ref 80.0–100.0)
Platelets: 193 10*3/uL (ref 150–400)
RBC: 4.54 MIL/uL (ref 4.22–5.81)
RDW: 17.5 % — ABNORMAL HIGH (ref 11.5–15.5)
WBC: 4.1 10*3/uL (ref 4.0–10.5)
nRBC: 0 % (ref 0.0–0.2)

## 2018-07-08 LAB — MAGNESIUM: Magnesium: 2.2 mg/dL (ref 1.7–2.4)

## 2018-07-08 NOTE — Progress Notes (Signed)
Patient seen and examined at bedside, doing well. No complaints. Spoke with his wife [name] a well, she is ready for the patient. Vital signs are stable.  Physical exam shows clear to auscultation bilaterally, normal sinus rhythm, abdomen is nontender nondistended.  Patient will still be discharged home on oral Decadron as planned.  He has follow-up appointment with his PCP tomorrow, with Dr. Julien Nordmann from oncology on 6/30 and Dr. Mickeal Skinner from neuro oncology on July 19, 2018.  Patient and family aware of this.  Discharge summary completed on 07/05/2018.  No change since.  Call with further questions as needed.  Gerlean Ren MD [facility]

## 2018-07-08 NOTE — Progress Notes (Signed)
AVS given to patient and explained at the bedside. Medications and follow up appointments have been explained with pt verbalizing understanding.  

## 2018-07-10 ENCOUNTER — Inpatient Hospital Stay: Payer: BC Managed Care – PPO

## 2018-07-10 ENCOUNTER — Inpatient Hospital Stay: Payer: BC Managed Care – PPO | Admitting: Physician Assistant

## 2018-07-10 ENCOUNTER — Telehealth: Payer: Self-pay | Admitting: *Deleted

## 2018-07-10 NOTE — Telephone Encounter (Signed)
Pt wife called to advise pt was discharged this am and is unable to make appt today. Message to scheduling, pt will need to see provider on 7/7 prior to treatment

## 2018-07-12 ENCOUNTER — Other Ambulatory Visit: Payer: No Typology Code available for payment source

## 2018-07-17 ENCOUNTER — Inpatient Hospital Stay: Payer: No Typology Code available for payment source | Attending: Internal Medicine

## 2018-07-17 ENCOUNTER — Encounter: Payer: Self-pay | Admitting: Physician Assistant

## 2018-07-17 ENCOUNTER — Telehealth: Payer: Self-pay | Admitting: Physician Assistant

## 2018-07-17 ENCOUNTER — Inpatient Hospital Stay (HOSPITAL_BASED_OUTPATIENT_CLINIC_OR_DEPARTMENT_OTHER): Payer: No Typology Code available for payment source | Admitting: Physician Assistant

## 2018-07-17 ENCOUNTER — Other Ambulatory Visit: Payer: Self-pay

## 2018-07-17 ENCOUNTER — Inpatient Hospital Stay: Payer: No Typology Code available for payment source

## 2018-07-17 VITALS — BP 112/82 | HR 76 | Temp 98.7°F | Resp 18 | Ht 76.0 in | Wt 223.9 lb

## 2018-07-17 DIAGNOSIS — Z5111 Encounter for antineoplastic chemotherapy: Secondary | ICD-10-CM | POA: Diagnosis present

## 2018-07-17 DIAGNOSIS — C7B8 Other secondary neuroendocrine tumors: Secondary | ICD-10-CM | POA: Insufficient documentation

## 2018-07-17 DIAGNOSIS — C7931 Secondary malignant neoplasm of brain: Secondary | ICD-10-CM

## 2018-07-17 DIAGNOSIS — C7A8 Other malignant neuroendocrine tumors: Secondary | ICD-10-CM

## 2018-07-17 DIAGNOSIS — C7A1 Malignant poorly differentiated neuroendocrine tumors: Secondary | ICD-10-CM | POA: Insufficient documentation

## 2018-07-17 DIAGNOSIS — C7951 Secondary malignant neoplasm of bone: Secondary | ICD-10-CM

## 2018-07-17 DIAGNOSIS — Z79899 Other long term (current) drug therapy: Secondary | ICD-10-CM

## 2018-07-17 DIAGNOSIS — R634 Abnormal weight loss: Secondary | ICD-10-CM | POA: Insufficient documentation

## 2018-07-17 DIAGNOSIS — C7800 Secondary malignant neoplasm of unspecified lung: Secondary | ICD-10-CM | POA: Diagnosis not present

## 2018-07-17 DIAGNOSIS — E279 Disorder of adrenal gland, unspecified: Secondary | ICD-10-CM | POA: Diagnosis not present

## 2018-07-17 DIAGNOSIS — C3490 Malignant neoplasm of unspecified part of unspecified bronchus or lung: Secondary | ICD-10-CM

## 2018-07-17 LAB — CMP (CANCER CENTER ONLY)
ALT: 17 U/L (ref 0–44)
AST: 14 U/L — ABNORMAL LOW (ref 15–41)
Albumin: 3.1 g/dL — ABNORMAL LOW (ref 3.5–5.0)
Alkaline Phosphatase: 90 U/L (ref 38–126)
Anion gap: 11 (ref 5–15)
BUN: 20 mg/dL (ref 8–23)
CO2: 25 mmol/L (ref 22–32)
Calcium: 8.7 mg/dL — ABNORMAL LOW (ref 8.9–10.3)
Chloride: 97 mmol/L — ABNORMAL LOW (ref 98–111)
Creatinine: 0.88 mg/dL (ref 0.61–1.24)
GFR, Est AFR Am: >60 mL/min (ref >60)
GFR, Estimated: >60 mL/min (ref >60)
Glucose, Bld: 108 mg/dL — ABNORMAL HIGH (ref 70–99)
Potassium: 3.9 mmol/L (ref 3.5–5.1)
Sodium: 133 mmol/L — ABNORMAL LOW (ref 135–145)
Total Bilirubin: 0.6 mg/dL (ref 0.3–1.2)
Total Protein: 6 g/dL — ABNORMAL LOW (ref 6.5–8.1)

## 2018-07-17 LAB — CBC WITH DIFFERENTIAL (CANCER CENTER ONLY)
Abs Immature Granulocytes: 0.16 10*3/uL — ABNORMAL HIGH (ref 0.00–0.07)
Basophils Absolute: 0 10*3/uL (ref 0.0–0.1)
Basophils Relative: 0 %
Eosinophils Absolute: 0 10*3/uL (ref 0.0–0.5)
Eosinophils Relative: 0 %
HCT: 42.5 % (ref 39.0–52.0)
Hemoglobin: 14.4 g/dL (ref 13.0–17.0)
Immature Granulocytes: 2 %
Lymphocytes Relative: 5 %
Lymphs Abs: 0.4 10*3/uL — ABNORMAL LOW (ref 0.7–4.0)
MCH: 30.8 pg (ref 26.0–34.0)
MCHC: 33.9 g/dL (ref 30.0–36.0)
MCV: 90.8 fL (ref 80.0–100.0)
Monocytes Absolute: 0.4 10*3/uL (ref 0.1–1.0)
Monocytes Relative: 5 %
Neutro Abs: 7.4 10*3/uL (ref 1.7–7.7)
Neutrophils Relative %: 88 %
Platelet Count: 213 10*3/uL (ref 150–400)
RBC: 4.68 MIL/uL (ref 4.22–5.81)
RDW: 19.2 % — ABNORMAL HIGH (ref 11.5–15.5)
WBC Count: 8.3 10*3/uL (ref 4.0–10.5)
nRBC: 0 % (ref 0.0–0.2)

## 2018-07-17 LAB — MAGNESIUM: Magnesium: 1.7 mg/dL (ref 1.7–2.4)

## 2018-07-17 MED ORDER — POTASSIUM CHLORIDE 2 MEQ/ML IV SOLN: Freq: Once | INTRAVENOUS | Status: DC

## 2018-07-17 MED ORDER — POTASSIUM CHLORIDE 2 MEQ/ML IV SOLN
Freq: Once | INTRAVENOUS | Status: AC
  Administered 2018-07-17: 11:00:00 via INTRAVENOUS
  Filled 2018-07-17: qty 10

## 2018-07-17 MED ORDER — SODIUM CHLORIDE 0.9 % IV SOLN
Freq: Once | INTRAVENOUS | Status: AC
  Administered 2018-07-17: 10:00:00 via INTRAVENOUS
  Filled 2018-07-17: qty 250

## 2018-07-17 NOTE — Progress Notes (Signed)
[facility] OFFICE PROGRESS NOTE  Patient, No Pcp Per No address on file  DIAGNOSIS: Metastatic neuroendocrine carcinoma initially thought to be metastatic disease from the right tonsil but the patient also has multiple metastatic disease in the lung mediastinal lymph nodes as well as now bone, brain and adrenal glands.   PRIOR THERAPY: He was treated with several chemotherapy regimens including cisplatin and etoposide as well as immunotherapy with ipilimumab and nivolumab and most recently with Tecentriq and Avastin. Unfortunately the patient continues to have evidence for disease progression.  CURRENT THERAPY: Systemic chemotherapy with cisplatin 25 mg/M2 and irinotecan 50 mg/M2 on days 1 and 8 every 3 weeks. First dose May 21, 2018. Status post 2 cycles of treatment.   INTERVAL HISTORY: Austyn Seier 63 y.o. male returns to the clinic for a follow-up visit.  Since his last appointment, the patient was admitted to the hospital for weakness.  The day before his admission, the patient was was ambulating out of his bathroom when he felt weak and slid onto the floor of his bathroom.  He was unable to get up.  He was found the next morning and was noted to have confusion.  While in the emergency room, a brain MRI was performed which showed a large mass in the right frontal lobe with interval enlargement. 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 The patient is scheduled for a follow up with Dr. Mickeal Skinner, neuro oncologist, for further evaluation and discussion of treatment options. His follow up is scheduled for 07/19/2018. The patient was placed on dexamethasone 4 mg every 6 hours per radiation oncology recommendation. This will be titrated as an outpatient. The patient is unsure what dose he is taking presently. The patient seemed forgetful today and did not  recall several events of his recent hospitalization. The patient's wife told staff that the patient has been experiencing issues with his short-term memory. The patient missed day 8 of cycle #2 of his chemotherapy due to this hospitalization.  Since discharge, the patient is currently at home and being cared for under the care of his wife. Today, he denies any fever, chills, or night sweats. He continues to lose a significant amount of weight despite having an appetite and no changes in his intake. A referral was sent last month for evaluation by nutrition; however, nutritional services was unable to get in touch with the patient despite several attempts. The patient was hospitalized shortly after the referral was placed, and therefore, was unable to be evaluated. He denies any chest pain, shortness of breath, cough, or hemoptysis.  He denies any nausea, vomiting, diarrhea, or constipation.  He denies any headache, gait changes, or visual changes. The patient recently had a restaging CT scan of the chest performed while in the hospital.  The patient is here today for evaluation and to review his scan before starting cycle #3.   MEDICAL HISTORY: Past Medical History:  Diagnosis Date  . Cancer of tonsil ([place])   . Chemotherapy induced nausea and vomiting   . Chemotherapy induced neutropenia ([place])   . Herpes zoster   . Meningitis   . Metastatic cancer to brain [facility])     ALLERGIES:  has No Known Allergies.  MEDICATIONS:  Current Outpatient Medications  Medication Sig Dispense Refill  . dexamethasone (DECADRON) 4 MG tablet Take 1 tablet (4 mg total) by mouth every  6 (six) hours for 30 days. 120 tablet 0  . diphenoxylate-atropine (LOMOTIL) 2.5-0.025 MG tablet Take 1 tablet by mouth 4 (four) times daily as needed for diarrhea or loose stools. (Patient not taking: Reported on 06/19/2018) 30 tablet 0  . naproxen sodium (ALEVE) 220 MG tablet Take 440 mg by mouth 2 (two) times daily as needed (pain).    .  prochlorperazine (COMPAZINE) 10 MG tablet Take 1 tablet (10 mg total) by mouth every 6 (six) hours as needed for nausea or vomiting. 30 tablet 0   No current facility-administered medications for this visit.     SURGICAL HISTORY:  Past Surgical History:  Procedure Laterality Date  . KNEE SURGERY    . LUNG SURGERY Right    for neuroendocrine tumor in [place]  . PEG PLACEMENT      REVIEW OF SYSTEMS:   Review of Systems  Constitutional: Negative for appetite change, chills, fatigue, fever and unexpected weight change.  HENT:   Negative for mouth sores, nosebleeds, sore throat and trouble swallowing.   Eyes: Negative for eye problems and icterus.  Respiratory: Negative for cough, hemoptysis, shortness of breath and wheezing.   Cardiovascular: Negative for chest pain and leg swelling.  Gastrointestinal: Negative for abdominal pain, constipation, diarrhea, nausea and vomiting.  Genitourinary: Negative for bladder incontinence, difficulty urinating, dysuria, frequency and hematuria.   Musculoskeletal: Negative for back pain, gait problem, neck pain and neck stiffness.  Skin: Negative for itching and rash.  Neurological: Negative for dizziness, extremity weakness, gait problem, headaches, light-headedness and seizures.  Hematological: Negative for adenopathy. Does not bruise/bleed easily.  Psychiatric/Behavioral: Negative for depression and sleep disturbance. The patient is not nervous/anxious.     PHYSICAL EXAMINATION:  Blood pressure 112/82, pulse 76, temperature 98.7 F (37.1 C), temperature source Oral, resp. rate 18, height 6\' 4"  (1.93 m), weight 223 lb 14.4 oz (101.6 kg), SpO2 98 %.  ECOG PERFORMANCE STATUS: 1 - Symptomatic but completely ambulatory  Physical Exam  Constitutional: Oriented to person, place, and time and well-developed, well-nourished, and in no distress.  HENT:  Head: Normocephalic and atraumatic.  Mouth/Throat: Oropharynx is clear and moist. No oropharyngeal  exudate.  Eyes: Conjunctivae are normal. Right eye exhibits no discharge. Left eye exhibits no discharge. No scleral icterus.  Neck: Normal range of motion. Neck supple.  Cardiovascular: Normal rate, regular rhythm, normal heart sounds and intact distal pulses.   Pulmonary/Chest: Effort normal and breath sounds normal. No respiratory distress. No wheezes. No rales.  Abdominal: Soft. Bowel sounds are normal. Exhibits no distension and no mass. There is no tenderness.  Musculoskeletal: Normal range of motion. Exhibits no edema.  Lymphadenopathy:    No cervical adenopathy.  Neurological: Alert and oriented to person, place, and time. Exhibits normal muscle tone. Gait normal. Coordination normal.  Skin: Skin is warm and dry. No rash noted. Not diaphoretic. No erythema. No pallor.  Psychiatric: The patient seemed forgetful today. Per the patient's wife, he has been experiencing a decline in his short term memory. Vitals reviewed.  LABORATORY DATA: Lab Results  Component Value Date   WBC 8.3 07/17/2018   HGB 14.4 07/17/2018   HCT 42.5 07/17/2018   MCV 90.8 07/17/2018   PLT 213 07/17/2018      Chemistry      Component Value Date/Time   NA 133 (L) 07/17/2018 0842   K 3.9 07/17/2018 0842   CL 97 (L) 07/17/2018 0842   CO2 25 07/17/2018 0842   BUN 20 07/17/2018 0842  CREATININE 0.88 07/17/2018 0842      Component Value Date/Time   CALCIUM 8.7 (L) 07/17/2018 0842   ALKPHOS 90 07/17/2018 0842   AST 14 (L) 07/17/2018 0842   ALT 17 07/17/2018 0842   BILITOT 0.6 07/17/2018 0842       RADIOGRAPHIC STUDIES:  Dg Chest 2 View  Result Date: 07/02/2018 CLINICAL DATA:  Weakness, history stage IV lung cancer EXAM: CHEST - 2 VIEW COMPARISON:  Portable exam 0808 hours compared to CT chest 05/11/2018 FINDINGS: Normal heart size, mediastinal contours, and pulmonary vascularity. Mild chronic RIGHT basilar atelectasis. RIGHT suprahilar nodular density unchanged corresponding to known tumor. Lungs  otherwise clear. No infiltrate, pleural effusion or pneumothorax. IMPRESSION: Known RIGHT upper lobe tumor and chronic RIGHT basilar atelectasis. No acute abnormalities. Electronically Signed   By: Lavonia Dana M.D.   On: 07/02/2018 08:15   Ct Head Wo Contrast  Result Date: 07/02/2018 CLINICAL DATA:  Patient with known metastatic lung carcinoma with increased confusion and altered mental status EXAM: CT HEAD WITHOUT CONTRAST TECHNIQUE: Contiguous axial images were obtained from the base of the skull through the vertex without intravenous contrast. COMPARISON:  Brain MRI April 27, 2018 FINDINGS: Brain: Diffuse enlargement of the ventricles is a stable finding. The sulci appear unremarkable. There is extensive vasogenic edema throughout much of the right frontal lobe which was present previously but appears slightly more extensive. No other vasogenic edema is evident. The known multiple masses throughout the brain documented on prior MR are not delineated focally on this noncontrast enhanced study. There is no hemorrhage. There Is impression on the frontal horn the right lateral ventricle due to the edema, more extensive than on the previous study. No midline shift evident, however. No extra-axial fluid collections are appreciable. Vascular: No appreciable hyperdense vessel. There is mild calcification in each carotid siphon region. Skull: The bony calvarium appears intact. Sinuses/Orbits: There is mucosal thickening in several ethmoid air cells. Other visualized paranasal sinuses are clear. Orbits appear symmetric bilaterally. Other: Visualized mastoid air cells are clear. IMPRESSION: 1. There is extensive vasogenic edema throughout the right frontal lobe which shows a slight increase compared to prior MR from April 2020. There is more mass effect on the frontal horn the right lateral ventricle compared to the previous study. The known mass in this area is not well delineated on this noncontrast enhanced study. 2.  Diffuse ventricular enlargement remains. Suspect a degree of underlying normal pressure hydrocephalus. 3. Recent MR showed multiple masses throughout the brain which are not delineated on this noncontrast enhanced study. If further evaluation of these masses and direct comparison of these masses compared to the previous study is felt to be warranted, would advise MR pre and post-contrast to further evaluate. 4. No acute infarct is demonstrable. No hemorrhage. No midline shift. 5.  Mild mucosal thickening noted in several ethmoid air cells. Electronically Signed   By: Lowella Grip III M.D.   On: 07/02/2018 08:57   Ct Chest W Contrast  Result Date: 07/03/2018 CLINICAL DATA:  Restaging neuroendocrine carcinoma, mets to lung, brain EXAM: CT CHEST, ABDOMEN, AND PELVIS WITH CONTRAST TECHNIQUE: Multidetector CT imaging of the chest, abdomen and pelvis was performed following the standard protocol during bolus administration of intravenous contrast. CONTRAST:  128mL OMNIPAQUE IOHEXOL 300 MG/ML SOLN, 8mL OMNIPAQUE IOHEXOL 300 MG/ML SOLN, additional oral enteric contrast COMPARISON:  CT chest abdomen pelvis, 05/11/2018 FINDINGS: CT CHEST FINDINGS Cardiovascular: Left coronary artery calcifications. Normal heart size. No pericardial effusion. Mediastinum/Nodes: No enlarged mediastinal,  hilar, or axillary lymph nodes. Coarse calcifications in the anterior mediastinum. Thyroid gland, trachea, and esophagus demonstrate no significant findings. Lungs/Pleura: Significant interval decrease in size of bulky masses of the suprahilar right upper lobe, largest mass previously measuring 3.9 cm, now an irregular residual of soft tissue measuring approximately 1.7 x 0.7 cm (series 4, image 52). A larger adjacent nodule measures 1.8 x 1.5 cm, previously 3.0 x 1.9 cm when measured similarly. Redemonstrated postoperative findings of right lower lobe wedge resection. No pleural effusion or pneumothorax. Musculoskeletal: No chest wall  mass. Unchanged sclerosis of the T6 vertebral body. CT ABDOMEN PELVIS FINDINGS Hepatobiliary: No solid liver abnormality is seen. No gallstones, gallbladder wall thickening, or biliary dilatation. Pancreas: Unremarkable. No pancreatic ductal dilatation or surrounding inflammatory changes. Spleen: Normal in size without significant abnormality. Adrenals/Urinary Tract: Significant interval reduction in size of a bulky left adrenal mass, now measuring 7.4 x 5.0 cm, previously 10.8 x 7.3 cm when measured similarly (series 2, image 69). A right adrenal nodule is however slightly enlarged, now measuring 2.8 x 2.2 cm, previously 2.3 x 1.6 cm when measured similarly (series 2, image 66). Punctuate nonobstructive left nephrolithiasis. No hydronephrosis. Bladder is unremarkable. Stomach/Bowel: Stomach is within normal limits. Appendix appears normal. No evidence of bowel wall thickening, distention, or inflammatory changes. Sigmoid diverticulosis. Vascular/Lymphatic: No significant vascular findings are present. No enlarged abdominal or pelvic lymph nodes. Reproductive: No mass or other abnormality. Other: No abdominal wall hernia or abnormality. No abdominopelvic ascites. Small nodule in the left paracolic gutter measuring 1.1 cm is stable compared to immediate prior examination (not previously appreciated due to significant motion artifact in this vicinity) although new in comparison to examination dated 01/26/2018 (series 2, image 104). A previously noted peritoneal or omental nodule adjacent to hepatic flexure of the colon now measures approximately 6 mm, previously 1.2 cm (series 2, image 59). Musculoskeletal: No other acute or significant osseous findings. IMPRESSION: 1.  Evidence of mixed response of metastatic disease. 2. Significant interval decrease in size of bulky masses of the suprahilar right upper lobe, largest mass previously measuring 3.9 cm, now an irregular residual of soft tissue measuring approximately  1.7 x 0.7 cm (series 4, image 52). A larger adjacent nodule measures 1.8 x 1.5 cm, previously 3.0 x 1.9 cm when measured similarly. 3. Significant interval reduction in size of a bulky left adrenal mass, now measuring 7.4 x 5.0 cm, previously 10.8 x 7.3 cm when measured similarly (series 2, image 69). A right adrenal nodule is however slightly enlarged, now measuring 2.8 x 2.2 cm, previously 2.3 x 1.6 cm when measured similarly (series 2, image 66). 4. Small nodule in the left paracolic gutter measuring 1.1 cm is stable compared to immediate prior examination (not previously appreciated due to significant motion artifact in this vicinity) although new in comparison to examination dated 01/26/2018 (series 2, image 104). A previously noted peritoneal or omental nodule adjacent to hepatic flexure of the colon now measures approximately 6 mm, previously 1.2 cm (series 2, image 59). 5. Unchanged sclerosis of the T6 vertebral body, which remains suspicious for metastatic disease. 6.  Other chronic and incidental findings as detailed above. Electronically Signed   By: Eddie Candle M.D.   On: 07/03/2018 21:51   Mr Jeri Cos And Wo Contrast  Result Date: 07/02/2018 CLINICAL DATA:  Metastatic disease with progression on CT. EXAM: MRI HEAD WITHOUT AND WITH CONTRAST TECHNIQUE: Multiplanar, multiecho pulse sequences of the brain and surrounding structures were obtained without and with  intravenous contrast. CONTRAST:  10 mL Gadovist IV COMPARISON:  CT head 07/02/2018, MRI head 04/27/2018 FINDINGS: Brain: Image quality degraded by significant motion. Marked ventricular enlargement similar to the prior MRI. Marked dilatation of the lateral third and fourth ventricles suggesting communicating hydrocephalus. This could be due to leptomeningeal tumor although I do not see leptomeningeal enhancement. Large enhancing mass right frontal lobe has enlarged now measuring 3.9 x 4.2 cm. Progression of surrounding edema and mass-effect on  the right frontal horn. Numerous enhancing lesions are seen on the prior MRI which are not well identified on today's study due to extensive motion. Right temporal lesion may have enlarged in the interval. Negative for hemorrhage. No acute infarct. Vascular: Normal arterial flow voids Skull and upper cervical spine: Negative Sinuses/Orbits: Negative Other: None IMPRESSION: Image quality degraded by significant motion. This significantly limits evaluation of known metastatic disease to brain. Large mass in the right frontal lobe shows significant interval enlargement since the prior MRI of 04/27/2018, now measuring 3.9 x 4.2 cm. Increased edema and mass-effect. Multiple additional enhancing lesions on the prior MRI are not well seen on today's study likely due to motion. Severe communicating hydrocephalus unchanged. While this could be due to leptomeningeal carcinomatosis and obstruction of CSF resorption, no MRI findings of leptomeningeal carcinomatosis are identified. Electronically Signed   By: Franchot Gallo M.D.   On: 07/02/2018 15:29   US Scrotum  Result Date: 07/02/2018 CLINICAL DATA:  63 year old male with history of left-sided testicular swelling. EXAM: SCROTAL ULTRASOUND DOPPLER ULTRASOUND OF THE TESTICLES TECHNIQUE: Complete ultrasound examination of the testicles, epididymis, and other scrotal structures was performed. Color and spectral Doppler ultrasound were also utilized to evaluate blood flow to the testicles. COMPARISON:  No priors. FINDINGS: Right testicle Measurements: 4.2 x 2.5 x 3.1 cm. No mass or microlithiasis visualized. Left testicle Measurements: 4.2 x 3.3 x 3.5 cm. No mass or microlithiasis visualized. Right epididymis:  Normal in size and appearance. Left epididymis:  Normal in size and appearance. Hydrocele: Left-sided hydrocele with low-level internal echogenicity. Varicocele:  None visualized. Pulsed Doppler interrogation of both testes demonstrates normal low resistance arterial  and venous waveforms bilaterally. IMPRESSION: 1. Moderate left-sided hydrocele. 2. Normal sonographic appearance of the testicles and epididymides bilaterally. Electronically Signed   By: Vinnie Langton M.D.   On: 07/02/2018 19:21   Ct Abdomen Pelvis W Contrast  Result Date: 07/03/2018 CLINICAL DATA:  Restaging neuroendocrine carcinoma, mets to lung, brain EXAM: CT CHEST, ABDOMEN, AND PELVIS WITH CONTRAST TECHNIQUE: Multidetector CT imaging of the chest, abdomen and pelvis was performed following the standard protocol during bolus administration of intravenous contrast. CONTRAST:  17mL OMNIPAQUE IOHEXOL 300 MG/ML SOLN, 100mL OMNIPAQUE IOHEXOL 300 MG/ML SOLN, additional oral enteric contrast COMPARISON:  CT chest abdomen pelvis, 05/11/2018 FINDINGS: CT CHEST FINDINGS Cardiovascular: Left coronary artery calcifications. Normal heart size. No pericardial effusion. Mediastinum/Nodes: No enlarged mediastinal, hilar, or axillary lymph nodes. Coarse calcifications in the anterior mediastinum. Thyroid gland, trachea, and esophagus demonstrate no significant findings. Lungs/Pleura: Significant interval decrease in size of bulky masses of the suprahilar right upper lobe, largest mass previously measuring 3.9 cm, now an irregular residual of soft tissue measuring approximately 1.7 x 0.7 cm (series 4, image 52). A larger adjacent nodule measures 1.8 x 1.5 cm, previously 3.0 x 1.9 cm when measured similarly. Redemonstrated postoperative findings of right lower lobe wedge resection. No pleural effusion or pneumothorax. Musculoskeletal: No chest wall mass. Unchanged sclerosis of the T6 vertebral body. CT ABDOMEN PELVIS FINDINGS Hepatobiliary: No  solid liver abnormality is seen. No gallstones, gallbladder wall thickening, or biliary dilatation. Pancreas: Unremarkable. No pancreatic ductal dilatation or surrounding inflammatory changes. Spleen: Normal in size without significant abnormality. Adrenals/Urinary Tract: Significant  interval reduction in size of a bulky left adrenal mass, now measuring 7.4 x 5.0 cm, previously 10.8 x 7.3 cm when measured similarly (series 2, image 69). A right adrenal nodule is however slightly enlarged, now measuring 2.8 x 2.2 cm, previously 2.3 x 1.6 cm when measured similarly (series 2, image 66). Punctuate nonobstructive left nephrolithiasis. No hydronephrosis. Bladder is unremarkable. Stomach/Bowel: Stomach is within normal limits. Appendix appears normal. No evidence of bowel wall thickening, distention, or inflammatory changes. Sigmoid diverticulosis. Vascular/Lymphatic: No significant vascular findings are present. No enlarged abdominal or pelvic lymph nodes. Reproductive: No mass or other abnormality. Other: No abdominal wall hernia or abnormality. No abdominopelvic ascites. Small nodule in the left paracolic gutter measuring 1.1 cm is stable compared to immediate prior examination (not previously appreciated due to significant motion artifact in this vicinity) although new in comparison to examination dated 01/26/2018 (series 2, image 104). A previously noted peritoneal or omental nodule adjacent to hepatic flexure of the colon now measures approximately 6 mm, previously 1.2 cm (series 2, image 59). Musculoskeletal: No other acute or significant osseous findings. IMPRESSION: 1.  Evidence of mixed response of metastatic disease. 2. Significant interval decrease in size of bulky masses of the suprahilar right upper lobe, largest mass previously measuring 3.9 cm, now an irregular residual of soft tissue measuring approximately 1.7 x 0.7 cm (series 4, image 52). A larger adjacent nodule measures 1.8 x 1.5 cm, previously 3.0 x 1.9 cm when measured similarly. 3. Significant interval reduction in size of a bulky left adrenal mass, now measuring 7.4 x 5.0 cm, previously 10.8 x 7.3 cm when measured similarly (series 2, image 69). A right adrenal nodule is however slightly enlarged, now measuring 2.8 x 2.2 cm,  previously 2.3 x 1.6 cm when measured similarly (series 2, image 66). 4. Small nodule in the left paracolic gutter measuring 1.1 cm is stable compared to immediate prior examination (not previously appreciated due to significant motion artifact in this vicinity) although new in comparison to examination dated 01/26/2018 (series 2, image 104). A previously noted peritoneal or omental nodule adjacent to hepatic flexure of the colon now measures approximately 6 mm, previously 1.2 cm (series 2, image 59). 5. Unchanged sclerosis of the T6 vertebral body, which remains suspicious for metastatic disease. 6.  Other chronic and incidental findings as detailed above. Electronically Signed   By: Eddie Candle M.D.   On: 07/03/2018 21:51     ASSESSMENT/PLAN:  This is a very pleasant 63 year old Caucasian male with metastatic neuroendocrine carcinoma initially thought to originate from the right tonsil.  The patient has metastatic disease to the lung, mediastinal lymph nodes, bone, brain, and adrenal glands.    He was treated with several chemotherapy regimens including cisplatin and etoposide as well as immunotherapy with ipilimumab and nivolumab and most recently with Tecentriq and Avastin.    The patient is currently undergoing treatment with cisplatin 25 mg/m and irinotecan 50 mg/m2 on days 1 and 8 every 3 weeks. He is status post 2 cycles of treatment. He tolerated it fairly well except for diarrhea and fatigue after cycle #1. The patient missed day 8 cycle #2 due to his recent hospitalization.  The patient recently had a restaging CT scan performed.  Dr. Julien Nordmann personally and independently reviewed the scan and discussed  results with the patient today.  The scan showed an overall significant improvement in his condition. There is a slight enlargement in the right adrenal nodule which will be monitored closely on future CT scans.   Dr. Julien Nordmann discussed the patient's current condition and treatment options.  Dr. Julien Nordmann recommends that the patient proceed with cycle #3 today as scheduled. The patient was initially interested in proceeding with treatment today and subsequently went to the infusion room.  While in the infusion room today, the patient was near completion of his pre-hydration fluids when he  stated that he was ready to go home. The patient was then seen by myself and Dr. Julien Nordmann to evaluate the change in his decision. His wife was available by phone. The patient has been experiencing some confusion and some short-term memory/cognitive declines recently per his wife's report. Dr. Julien Nordmann discussed that given his positive response to therapy, as evidenced by his most recent scan, that his recommendation is to continue with treatment. The patient stated that he is interested in receiving treatment but "does not feel up for it today" and wants to go home. The patient's wife encouraged him to stay but he was adamant that he wished to go home at this time. He stated that he would return next week to try again. The patient initially believed that he drove himself to his appointment today; however, after discussion with his wife, he was dropped off this morning. She will arrange transportation to pick him up.   I will reach out to the scheduling department to adjust his appointments. Although reminded earlier about the visitor restrictions, the patient believes that his wife will be accompanying him to his infusion appointment next week. We will reach out to determine if his wife can accompany him to his appointment next week to reorient him if needed.   We will see him back for a follow-up visit in 1 week for evaluation before re-starting cycle #3. We will also likely re-visit discussing goals of care at this time.  I recommend that the patient continue with his scheduled follow up visit with Dr. Mickeal Skinner, neuro oncologist, this week for further evaluation and recommendations regarding his recent brain MRI  and cognitive symptoms.    I will reach out to nutritional services for evaluation of his weight loss. I alerted the patient that they will likely be in touch with him soon.   The patient was advised to call immediately if he has any concerning symptoms in the interval. The patient voices understanding of current disease status and treatment options and is in agreement with the current care plan. All questions were answered. The patient knows to call the clinic with any problems, questions or concerns. We can certainly see the patient much sooner if necessary   No orders of the defined types were placed in this encounter.    Mihira Tozzi L Dayjah Selman, PA-C 07/17/18  ADDENDUM: Hematology/Oncology Attending: I had a face-to-face encounter with the patient today.  I recommended his care plan.  This is a very pleasant 63 years old white male with metastatic high-grade neuroendocrine carcinoma status post several chemotherapy regimens and he is currently undergoing systemic treatment with reduced dose cisplatin and irinotecan status post 2 cycles.  The patient has been tolerating the treatment well except for increasing fatigue and weakness.  He also has some memory issues recently and confusion at times.  He was recently admitted to [facility] long hospital with confusion and weakness as well as fall.  During  his hospitalization he had repeat imaging studies including CT scan of the chest, abdomen and pelvis. I personally and independently reviewed the scan with the patient and his wife today.  His a scan showed significant improvement of his disease. I recommended for the patient to continue his current treatment with reduced dose cisplatin and irinotecan.  He was supposed to start cycle #3 today but in the middle of his treatment he decided to go home and not to continue the treatment after receiving the initial IV hydration.  We contacted his wife who tried to convince him to proceed with the  treatment but the patient declined.  We will make sure that he has a safe drive back home.  We will arrange for him to come back for follow-up visit next week for reevaluation.  We may consider allowing his wife to accompany him during the treatment for comfort and reassurance and because of his confusion. The patient was advised to call immediately if he has any concerning symptoms in the interval.  Disclaimer: This note was dictated with voice recognition software. Similar sounding words can inadvertently be transcribed and may be missed upon review. Eilleen Kempf, MD 07/17/18

## 2018-07-17 NOTE — Telephone Encounter (Signed)
Scheduled appt per 7/07 sch message- unable to reach pt wife. Left message with appt date and time

## 2018-07-17 NOTE — Patient Instructions (Addendum)
Patient refused chemotherapy treatment on this date. Received pre-hydration fluids.

## 2018-07-17 NOTE — Progress Notes (Signed)
Patient was near completion of his pre-hydration fluids, when he stated that he was tired and ready to go home. I reminded him that he still had several hours before his treatment would be complete. Patient stated " I don't care. I'm ready to go." For clarification, I questioned the patient's intent, and confirmed with patient that he did not want to proceed with his treatment today. Patient stated that he was "too tired, and wanted to go home and lay down". Dr. Julien Nordmann was notified and came to speak with the patient, and he encouraged patient to stay for treatment. Patient's wife was called and was informed by Dr. Julien Nordmann of her husband's decision. Wife attempted to encourage pt to stay; however, patient still refused.

## 2018-07-18 ENCOUNTER — Telehealth: Payer: Self-pay

## 2018-07-18 ENCOUNTER — Telehealth: Payer: Self-pay | Admitting: Medical Oncology

## 2018-07-18 ENCOUNTER — Inpatient Hospital Stay: Payer: No Typology Code available for payment source | Admitting: Nutrition

## 2018-07-18 NOTE — Telephone Encounter (Signed)
Received message from the patient spouse requesting clarification on rescheduled appointments. Reviewed patients scheduled appointments and made aware that she has been approved to attend the Dr. Mickeal Skinner appt and the infusion appointment per scheduling notes. She verbalized understanding and had no other questions or concerns.

## 2018-07-18 NOTE — Progress Notes (Signed)
RD working remotely.  63 year old male diagnosed with metastatic brain cancer receiving chemotherapy.  PMH includes Meningitis and Tonsil cancer.  Medications include Decadron, Lomotil, and Compazine.  Labs include Na 133, Glucose 108 and Alb 3.1.  Height: 6'4". Weight:223.9 pounds on July 7. Weight on May 4 was 254 pounds. BMI: 27.25.  Estimated Nutrition Needs: 2300-2500 kcal, 120-135 grams protein, 2.5 L fluid.  Spoke with wife who reports patient has a good appetite and eats 3 meals daily. Attributes increased appetite to steroids. Patient has lost 30 pounds or 12% over the past 2 months which is significant. Wife denies patient has difficulty chewing or swallowing.  Has nausea after chemotherapy which is resolved with antiemetics. He has not been snacking or drinking protein shakes.  Nutrition Diagnosis: Unintended weight loss related to metastatic cancer and associated treatments as evidenced by 12% weight loss in less than 3 months.  Interventions: Add snacks between meals, trying a regular milkshake for now. I will provides samples of oral nutrition supplements for patient when he comes to MD visit. Emailed fact sheets on increasing calories and protein and soft protein foods. Questions answered and teach back method used. Patient has contact information.  Monitoring, Evaluation, Goals: Patient will tolerate increased calories and protein for weight stabilization/wt gain.  Next Visit: Wednesday, July 15 by telephone.

## 2018-07-18 NOTE — Telephone Encounter (Signed)
Dwayne Massey sign home health orders?   Per inpt social worker note-[facility] be setting up his home health needs once pt has PCP.

## 2018-07-19 ENCOUNTER — Telehealth: Payer: Self-pay | Admitting: *Deleted

## 2018-07-19 ENCOUNTER — Inpatient Hospital Stay: Payer: No Typology Code available for payment source | Admitting: Internal Medicine

## 2018-07-19 ENCOUNTER — Telehealth: Payer: Self-pay | Admitting: Internal Medicine

## 2018-07-19 NOTE — Telephone Encounter (Signed)
Per 7/9 schedule message 7/13 lab/port moved to 7/14 with tx and 7/13 f/u converted to telephone visit. Confirmed appointments with [name].

## 2018-07-19 NOTE — Telephone Encounter (Signed)
Patients spouse called to cancel MD visit with Dr Mickeal Skinner.  Patient doesn't want to come in today.  Did not reschedule at this time.  Wife notified to call back if they wish to be seen by Dr Mickeal Skinner.

## 2018-07-23 ENCOUNTER — Inpatient Hospital Stay: Payer: No Typology Code available for payment source

## 2018-07-23 ENCOUNTER — Encounter: Payer: Self-pay | Admitting: Physician Assistant

## 2018-07-23 ENCOUNTER — Inpatient Hospital Stay (HOSPITAL_BASED_OUTPATIENT_CLINIC_OR_DEPARTMENT_OTHER): Payer: No Typology Code available for payment source | Admitting: Physician Assistant

## 2018-07-23 DIAGNOSIS — C7B8 Other secondary neuroendocrine tumors: Secondary | ICD-10-CM | POA: Diagnosis not present

## 2018-07-23 DIAGNOSIS — Z5111 Encounter for antineoplastic chemotherapy: Secondary | ICD-10-CM

## 2018-07-23 DIAGNOSIS — C7931 Secondary malignant neoplasm of brain: Secondary | ICD-10-CM

## 2018-07-23 DIAGNOSIS — C7A8 Other malignant neuroendocrine tumors: Secondary | ICD-10-CM | POA: Diagnosis not present

## 2018-07-23 DIAGNOSIS — Z7189 Other specified counseling: Secondary | ICD-10-CM

## 2018-07-23 NOTE — Progress Notes (Signed)
[facility] HEMATOLOGY-ONCOLOGY TeleHEALTH VISIT PROGRESS NOTE   I connected with Dwayne Massey on 07/23/18 at  9:30 AM EDT by telephone and verified that I am speaking with the correct person using two identifiers.  I discussed the limitations, risks, security and privacy concerns of performing an evaluation and management service by telemedicine and the availability of in-person appointments. I also discussed with the patient that there may be a patient responsible charge related to this service. The patient expressed understanding and agreed to proceed.  Other persons participating in the visit and their role in the encounter: Dwayne Massey (spouse) and Dr. Julien Nordmann Patient's location: Home  Provider's location: Clinic  Patient, No Pcp Per No address on file  DIAGNOSIS: Metastatic neuroendocrine carcinoma initially thought to be metastatic disease from the right tonsil but the patient also has multiple metastatic disease in the lung mediastinal lymph nodes as well as now bone, brain and adrenal glands  PRIOR THERAPY: He was treated with several chemotherapy regimens including cisplatin and etoposide as well as immunotherapy with ipilimumab and nivolumab and most recently with Tecentriq and Avastin. Unfortunately the patient continues to have evidence for disease progression.  CURRENT THERAPY: Systemic chemotherapy with cisplatin 25 mg/M2 and irinotecan 50 mg/M2 on days 1 and 8 every 3 weeks. First dose May 21, 2018.Status post 2 cycles of treatment.  INTERVAL HISTORY: Dr. Julien Nordmann and I connected with the patient, Dwayne Massey, and his wife Dwayne Massey, via a phone visit today.  Last week, the patient was in the infusion room scheduled to receive cycle 3 of his treatment.  The patient was near completion of his prehydration fluids when he abruptly decided that he "did not feel up for" his treatment.  The patient was also noted to have some confusion which has been present since his recent  hospitalization in June 2020.  The patient was hospitalized a few weeks ago for confusion and weakness.  A brain MRI was performed in the emergency room which showed a large mass in the right frontal lobe with interval enlargement.  His case was reviewed at the multidisciplinary brain conference to determine if this is consistent with radiation necrosis versus true tumor progression.  The patient was scheduled to see Dr. Mickeal Skinner, neuro-oncologist, on July 19, 2018 for management of his condition.  Unfortunately, the patient did not make it to his appointment.  Per the patient's wife, the patient was supposed to be taking 4 mg dexamethasone every 6 hours.  She states that he is actually taking dexamethasone 2-3 times a day instead of the prescribed 4 times a day.  Going forward, we have allowed the patient's wife to accompany him during his infusions due to his confusion to comfort/reorient him.  The patient states that he feels fairly well today; however, he does endorse some new symptoms since seen last week.  The patient states that he is having some pain in his left calf and quadracept. He states this has been going on for approximately 2-3 days. He denies any lower extremity swelling or pain. He is not taking any blood thinners. He also notes a dull constant pain in his right posterior thoracic region. The pain is non-radiation. He states this has been present for 2-3 days as well. He denies any traumas or injuries. He rates his pain a 5/10 at its worse. He has been taking aleve to help his pain. He denies any associated fevers, chills, or night sweats. He denies any associated activities that exacerbate the pain such  as taking deep breaths, activity, or coughing. He denies any nausea, vomiting, diarrhea, or constipation. He denies any headaches or visual changes. He denies any chest pain, shortness of breath, cough, or hemoptysis. He denies any lower extremity numbness/tingling/or weakness. He denies any  changes in bowel or bladder incontinence or retention. The phone visit today is for evaluation before proceeding with cycle #3 tomorrow as scheduled.     MEDICAL HISTORY: Past Medical History:  Diagnosis Date  . Cancer of tonsil ([place])   . Chemotherapy induced nausea and vomiting   . Chemotherapy induced neutropenia ([place])   . Herpes zoster   . Meningitis   . Metastatic cancer to brain [facility])     ALLERGIES:  has No Known Allergies.  MEDICATIONS:  Current Outpatient Medications  Medication Sig Dispense Refill  . dexamethasone (DECADRON) 4 MG tablet Take 1 tablet (4 mg total) by mouth every 6 (six) hours for 30 days. 120 tablet 0  . diphenoxylate-atropine (LOMOTIL) 2.5-0.025 MG tablet Take 1 tablet by mouth 4 (four) times daily as needed for diarrhea or loose stools. (Patient not taking: Reported on 06/19/2018) 30 tablet 0  . naproxen sodium (ALEVE) 220 MG tablet Take 440 mg by mouth 2 (two) times daily as needed (pain).    . prochlorperazine (COMPAZINE) 10 MG tablet Take 1 tablet (10 mg total) by mouth every 6 (six) hours as needed for nausea or vomiting. 30 tablet 0   No current facility-administered medications for this visit.     SURGICAL HISTORY:  Past Surgical History:  Procedure Laterality Date  . KNEE SURGERY    . LUNG SURGERY Right    for neuroendocrine tumor in [place]  . PEG PLACEMENT      REVIEW OF SYSTEMS:   Review of Systems  Constitutional: Positive for weight loss. Negative for appetite change, chills, fatigue, and fever. HENT: Negative for mouth sores, nosebleeds, sore throat and trouble swallowing.   Eyes: Negative for eye problems and icterus.  Respiratory: Negative for cough, hemoptysis, shortness of breath and wheezing.   Cardiovascular: Negative for chest pain and leg swelling.  Gastrointestinal: Negative for abdominal pain, constipation, diarrhea, nausea and vomiting.  Genitourinary: Negative for bladder incontinence, difficulty urinating, dysuria, frequency and  hematuria.   Musculoskeletal: Positive for left calf pain and right posterior thoracic pain. Negative for gait problem, neck pain and neck stiffness.  Skin: Negative for itching and rash.  Neurological: Negative for dizziness, extremity weakness, gait problem, headaches, light-headedness and seizures.  Hematological: Negative for adenopathy. Does not bruise/bleed easily.  Psychiatric/Behavioral: Negative for confusion, depression and sleep disturbance. The patient is not nervous/anxious.     PHYSICAL EXAMINATION:  There were no vitals taken for this visit.  ECOG PERFORMANCE STATUS: 2 - Symptomatic, <50% confined to bed  Unable to assess physical exam via phone visit.   LABORATORY DATA: Lab Results  Component Value Date   WBC 8.3 07/17/2018   HGB 14.4 07/17/2018   HCT 42.5 07/17/2018   MCV 90.8 07/17/2018   PLT 213 07/17/2018      Chemistry      Component Value Date/Time   NA 133 (L) 07/17/2018 0842   K 3.9 07/17/2018 0842   CL 97 (L) 07/17/2018 0842   CO2 25 07/17/2018 0842   BUN 20 07/17/2018 0842   CREATININE 0.88 07/17/2018 0842      Component Value Date/Time   CALCIUM 8.7 (L) 07/17/2018 0842   ALKPHOS 90 07/17/2018 0842   AST 14 (L) 07/17/2018 5573  ALT 17 07/17/2018 0842   BILITOT 0.6 07/17/2018 0842       RADIOGRAPHIC STUDIES:  Dg Chest 2 View  Result Date: 07/02/2018 CLINICAL DATA:  Weakness, history stage IV lung cancer EXAM: CHEST - 2 VIEW COMPARISON:  Portable exam 0808 hours compared to CT chest 05/11/2018 FINDINGS: Normal heart size, mediastinal contours, and pulmonary vascularity. Mild chronic RIGHT basilar atelectasis. RIGHT suprahilar nodular density unchanged corresponding to known tumor. Lungs otherwise clear. No infiltrate, pleural effusion or pneumothorax. IMPRESSION: Known RIGHT upper lobe tumor and chronic RIGHT basilar atelectasis. No acute abnormalities. Electronically Signed   By: Lavonia Dana M.D.   On: 07/02/2018 08:15   Ct Head Wo  Contrast  Result Date: 07/02/2018 CLINICAL DATA:  Patient with known metastatic lung carcinoma with increased confusion and altered mental status EXAM: CT HEAD WITHOUT CONTRAST TECHNIQUE: Contiguous axial images were obtained from the base of the skull through the vertex without intravenous contrast. COMPARISON:  Brain MRI April 27, 2018 FINDINGS: Brain: Diffuse enlargement of the ventricles is a stable finding. The sulci appear unremarkable. There is extensive vasogenic edema throughout much of the right frontal lobe which was present previously but appears slightly more extensive. No other vasogenic edema is evident. The known multiple masses throughout the brain documented on prior MR are not delineated focally on this noncontrast enhanced study. There is no hemorrhage. There Is impression on the frontal horn the right lateral ventricle due to the edema, more extensive than on the previous study. No midline shift evident, however. No extra-axial fluid collections are appreciable. Vascular: No appreciable hyperdense vessel. There is mild calcification in each carotid siphon region. Skull: The bony calvarium appears intact. Sinuses/Orbits: There is mucosal thickening in several ethmoid air cells. Other visualized paranasal sinuses are clear. Orbits appear symmetric bilaterally. Other: Visualized mastoid air cells are clear. IMPRESSION: 1. There is extensive vasogenic edema throughout the right frontal lobe which shows a slight increase compared to prior MR from April 2020. There is more mass effect on the frontal horn the right lateral ventricle compared to the previous study. The known mass in this area is not well delineated on this noncontrast enhanced study. 2. Diffuse ventricular enlargement remains. Suspect a degree of underlying normal pressure hydrocephalus. 3. Recent MR showed multiple masses throughout the brain which are not delineated on this noncontrast enhanced study. If further evaluation of these  masses and direct comparison of these masses compared to the previous study is felt to be warranted, would advise MR pre and post-contrast to further evaluate. 4. No acute infarct is demonstrable. No hemorrhage. No midline shift. 5.  Mild mucosal thickening noted in several ethmoid air cells. Electronically Signed   By: Lowella Grip III M.D.   On: 07/02/2018 08:57   Ct Chest W Contrast  Result Date: 07/03/2018 CLINICAL DATA:  Restaging neuroendocrine carcinoma, mets to lung, brain EXAM: CT CHEST, ABDOMEN, AND PELVIS WITH CONTRAST TECHNIQUE: Multidetector CT imaging of the chest, abdomen and pelvis was performed following the standard protocol during bolus administration of intravenous contrast. CONTRAST:  11mL OMNIPAQUE IOHEXOL 300 MG/ML SOLN, 1mL OMNIPAQUE IOHEXOL 300 MG/ML SOLN, additional oral enteric contrast COMPARISON:  CT chest abdomen pelvis, 05/11/2018 FINDINGS: CT CHEST FINDINGS Cardiovascular: Left coronary artery calcifications. Normal heart size. No pericardial effusion. Mediastinum/Nodes: No enlarged mediastinal, hilar, or axillary lymph nodes. Coarse calcifications in the anterior mediastinum. Thyroid gland, trachea, and esophagus demonstrate no significant findings. Lungs/Pleura: Significant interval decrease in size of bulky masses of the suprahilar right upper  lobe, largest mass previously measuring 3.9 cm, now an irregular residual of soft tissue measuring approximately 1.7 x 0.7 cm (series 4, image 52). A larger adjacent nodule measures 1.8 x 1.5 cm, previously 3.0 x 1.9 cm when measured similarly. Redemonstrated postoperative findings of right lower lobe wedge resection. No pleural effusion or pneumothorax. Musculoskeletal: No chest wall mass. Unchanged sclerosis of the T6 vertebral body. CT ABDOMEN PELVIS FINDINGS Hepatobiliary: No solid liver abnormality is seen. No gallstones, gallbladder wall thickening, or biliary dilatation. Pancreas: Unremarkable. No pancreatic ductal  dilatation or surrounding inflammatory changes. Spleen: Normal in size without significant abnormality. Adrenals/Urinary Tract: Significant interval reduction in size of a bulky left adrenal mass, now measuring 7.4 x 5.0 cm, previously 10.8 x 7.3 cm when measured similarly (series 2, image 69). A right adrenal nodule is however slightly enlarged, now measuring 2.8 x 2.2 cm, previously 2.3 x 1.6 cm when measured similarly (series 2, image 66). Punctuate nonobstructive left nephrolithiasis. No hydronephrosis. Bladder is unremarkable. Stomach/Bowel: Stomach is within normal limits. Appendix appears normal. No evidence of bowel wall thickening, distention, or inflammatory changes. Sigmoid diverticulosis. Vascular/Lymphatic: No significant vascular findings are present. No enlarged abdominal or pelvic lymph nodes. Reproductive: No mass or other abnormality. Other: No abdominal wall hernia or abnormality. No abdominopelvic ascites. Small nodule in the left paracolic gutter measuring 1.1 cm is stable compared to immediate prior examination (not previously appreciated due to significant motion artifact in this vicinity) although new in comparison to examination dated 01/26/2018 (series 2, image 104). A previously noted peritoneal or omental nodule adjacent to hepatic flexure of the colon now measures approximately 6 mm, previously 1.2 cm (series 2, image 59). Musculoskeletal: No other acute or significant osseous findings. IMPRESSION: 1.  Evidence of mixed response of metastatic disease. 2. Significant interval decrease in size of bulky masses of the suprahilar right upper lobe, largest mass previously measuring 3.9 cm, now an irregular residual of soft tissue measuring approximately 1.7 x 0.7 cm (series 4, image 52). A larger adjacent nodule measures 1.8 x 1.5 cm, previously 3.0 x 1.9 cm when measured similarly. 3. Significant interval reduction in size of a bulky left adrenal mass, now measuring 7.4 x 5.0 cm, previously  10.8 x 7.3 cm when measured similarly (series 2, image 69). A right adrenal nodule is however slightly enlarged, now measuring 2.8 x 2.2 cm, previously 2.3 x 1.6 cm when measured similarly (series 2, image 66). 4. Small nodule in the left paracolic gutter measuring 1.1 cm is stable compared to immediate prior examination (not previously appreciated due to significant motion artifact in this vicinity) although new in comparison to examination dated 01/26/2018 (series 2, image 104). A previously noted peritoneal or omental nodule adjacent to hepatic flexure of the colon now measures approximately 6 mm, previously 1.2 cm (series 2, image 59). 5. Unchanged sclerosis of the T6 vertebral body, which remains suspicious for metastatic disease. 6.  Other chronic and incidental findings as detailed above. Electronically Signed   By: Eddie Candle M.D.   On: 07/03/2018 21:51   Mr Jeri Cos And Wo Contrast  Result Date: 07/02/2018 CLINICAL DATA:  Metastatic disease with progression on CT. EXAM: MRI HEAD WITHOUT AND WITH CONTRAST TECHNIQUE: Multiplanar, multiecho pulse sequences of the brain and surrounding structures were obtained without and with intravenous contrast. CONTRAST:  10 mL Gadovist IV COMPARISON:  CT head 07/02/2018, MRI head 04/27/2018 FINDINGS: Brain: Image quality degraded by significant motion. Marked ventricular enlargement similar to the prior MRI. Marked dilatation  of the lateral third and fourth ventricles suggesting communicating hydrocephalus. This could be due to leptomeningeal tumor although I do not see leptomeningeal enhancement. Large enhancing mass right frontal lobe has enlarged now measuring 3.9 x 4.2 cm. Progression of surrounding edema and mass-effect on the right frontal horn. Numerous enhancing lesions are seen on the prior MRI which are not well identified on today's study due to extensive motion. Right temporal lesion may have enlarged in the interval. Negative for hemorrhage. No acute  infarct. Vascular: Normal arterial flow voids Skull and upper cervical spine: Negative Sinuses/Orbits: Negative Other: None IMPRESSION: Image quality degraded by significant motion. This significantly limits evaluation of known metastatic disease to brain. Large mass in the right frontal lobe shows significant interval enlargement since the prior MRI of 04/27/2018, now measuring 3.9 x 4.2 cm. Increased edema and mass-effect. Multiple additional enhancing lesions on the prior MRI are not well seen on today's study likely due to motion. Severe communicating hydrocephalus unchanged. While this could be due to leptomeningeal carcinomatosis and obstruction of CSF resorption, no MRI findings of leptomeningeal carcinomatosis are identified. Electronically Signed   By: Franchot Gallo M.D.   On: 07/02/2018 15:29   US Scrotum  Result Date: 07/02/2018 CLINICAL DATA:  63 year old male with history of left-sided testicular swelling. EXAM: SCROTAL ULTRASOUND DOPPLER ULTRASOUND OF THE TESTICLES TECHNIQUE: Complete ultrasound examination of the testicles, epididymis, and other scrotal structures was performed. Color and spectral Doppler ultrasound were also utilized to evaluate blood flow to the testicles. COMPARISON:  No priors. FINDINGS: Right testicle Measurements: 4.2 x 2.5 x 3.1 cm. No mass or microlithiasis visualized. Left testicle Measurements: 4.2 x 3.3 x 3.5 cm. No mass or microlithiasis visualized. Right epididymis:  Normal in size and appearance. Left epididymis:  Normal in size and appearance. Hydrocele: Left-sided hydrocele with low-level internal echogenicity. Varicocele:  None visualized. Pulsed Doppler interrogation of both testes demonstrates normal low resistance arterial and venous waveforms bilaterally. IMPRESSION: 1. Moderate left-sided hydrocele. 2. Normal sonographic appearance of the testicles and epididymides bilaterally. Electronically Signed   By: Vinnie Langton M.D.   On: 07/02/2018 19:21   Ct  Abdomen Pelvis W Contrast  Result Date: 07/03/2018 CLINICAL DATA:  Restaging neuroendocrine carcinoma, mets to lung, brain EXAM: CT CHEST, ABDOMEN, AND PELVIS WITH CONTRAST TECHNIQUE: Multidetector CT imaging of the chest, abdomen and pelvis was performed following the standard protocol during bolus administration of intravenous contrast. CONTRAST:  129mL OMNIPAQUE IOHEXOL 300 MG/ML SOLN, 34mL OMNIPAQUE IOHEXOL 300 MG/ML SOLN, additional oral enteric contrast COMPARISON:  CT chest abdomen pelvis, 05/11/2018 FINDINGS: CT CHEST FINDINGS Cardiovascular: Left coronary artery calcifications. Normal heart size. No pericardial effusion. Mediastinum/Nodes: No enlarged mediastinal, hilar, or axillary lymph nodes. Coarse calcifications in the anterior mediastinum. Thyroid gland, trachea, and esophagus demonstrate no significant findings. Lungs/Pleura: Significant interval decrease in size of bulky masses of the suprahilar right upper lobe, largest mass previously measuring 3.9 cm, now an irregular residual of soft tissue measuring approximately 1.7 x 0.7 cm (series 4, image 52). A larger adjacent nodule measures 1.8 x 1.5 cm, previously 3.0 x 1.9 cm when measured similarly. Redemonstrated postoperative findings of right lower lobe wedge resection. No pleural effusion or pneumothorax. Musculoskeletal: No chest wall mass. Unchanged sclerosis of the T6 vertebral body. CT ABDOMEN PELVIS FINDINGS Hepatobiliary: No solid liver abnormality is seen. No gallstones, gallbladder wall thickening, or biliary dilatation. Pancreas: Unremarkable. No pancreatic ductal dilatation or surrounding inflammatory changes. Spleen: Normal in size without significant abnormality. Adrenals/Urinary Tract: Significant interval  reduction in size of a bulky left adrenal mass, now measuring 7.4 x 5.0 cm, previously 10.8 x 7.3 cm when measured similarly (series 2, image 69). A right adrenal nodule is however slightly enlarged, now measuring 2.8 x 2.2 cm,  previously 2.3 x 1.6 cm when measured similarly (series 2, image 66). Punctuate nonobstructive left nephrolithiasis. No hydronephrosis. Bladder is unremarkable. Stomach/Bowel: Stomach is within normal limits. Appendix appears normal. No evidence of bowel wall thickening, distention, or inflammatory changes. Sigmoid diverticulosis. Vascular/Lymphatic: No significant vascular findings are present. No enlarged abdominal or pelvic lymph nodes. Reproductive: No mass or other abnormality. Other: No abdominal wall hernia or abnormality. No abdominopelvic ascites. Small nodule in the left paracolic gutter measuring 1.1 cm is stable compared to immediate prior examination (not previously appreciated due to significant motion artifact in this vicinity) although new in comparison to examination dated 01/26/2018 (series 2, image 104). A previously noted peritoneal or omental nodule adjacent to hepatic flexure of the colon now measures approximately 6 mm, previously 1.2 cm (series 2, image 59). Musculoskeletal: No other acute or significant osseous findings. IMPRESSION: 1.  Evidence of mixed response of metastatic disease. 2. Significant interval decrease in size of bulky masses of the suprahilar right upper lobe, largest mass previously measuring 3.9 cm, now an irregular residual of soft tissue measuring approximately 1.7 x 0.7 cm (series 4, image 52). A larger adjacent nodule measures 1.8 x 1.5 cm, previously 3.0 x 1.9 cm when measured similarly. 3. Significant interval reduction in size of a bulky left adrenal mass, now measuring 7.4 x 5.0 cm, previously 10.8 x 7.3 cm when measured similarly (series 2, image 69). A right adrenal nodule is however slightly enlarged, now measuring 2.8 x 2.2 cm, previously 2.3 x 1.6 cm when measured similarly (series 2, image 66). 4. Small nodule in the left paracolic gutter measuring 1.1 cm is stable compared to immediate prior examination (not previously appreciated due to significant motion  artifact in this vicinity) although new in comparison to examination dated 01/26/2018 (series 2, image 104). A previously noted peritoneal or omental nodule adjacent to hepatic flexure of the colon now measures approximately 6 mm, previously 1.2 cm (series 2, image 59). 5. Unchanged sclerosis of the T6 vertebral body, which remains suspicious for metastatic disease. 6.  Other chronic and incidental findings as detailed above. Electronically Signed   By: Eddie Candle M.D.   On: 07/03/2018 21:51     ASSESSMENT/PLAN:  This is a very pleasant 63 year old Caucasian male with metastatic neuroendocrine carcinoma initially thought to originate from the right tonsil. The patient has metastatic disease to the lung, mediastinal lymph nodes,bone, brain, and adrenal glands.  He was treated with several chemotherapy regimens including cisplatin and etoposide as well as immunotherapy with ipilimumab and nivolumab and most recently with Tecentriq and Avastin.  The patient is currently undergoing treatment with cisplatin 25 mg/m andirinotecan 50 mg/m2 on days 1 and 8 every 3 weeks. He is status post 2 cycles of treatment. He tolerated itfairly wellexcept for diarrheaand fatigue after cycle #1. The patient missed day 8 cycle #2 due to his recent hospitalization. The patient started his pre-hydration fluids last week to start cycle #3 when he decided that he did not want to receive treatment that day and wanted to delay treatment until 07/24/2018.  Dr. Julien Nordmann and I spoke to the patient today about his current condition and treatment options. Given his most recent CT scan showing a positive response to treatment, Dr. Julien Nordmann recommends he  proceed with treatment tomorrow if the patient is interested in continuing treatment. The patient is interested in continuing treatment tomorrow as planned. We also have permitted the patient's wife to accompany him during his infusion tomorrow to help reorient him/comfort him  during his infusion.  We will see the patient back for a follow up visit tomorrow for evaluation and routine lab work before starting cycle #3 tomorrow. I will see the patient in the infusion room to further assess his back and calf pain.   Regarding his weight loss, the nutritionist has provided ensure samples for the patient. We will deliver this to the patient while in the clinic tomorrow.   I discussed the assessment and treatment plan with the patient. The patient was provided an opportunity to ask questions and all were answered. The patient agreed with the plan and demonstrated an understanding of the instructions.  The patient was advised to call back or seek an in-person evaluation if the symptoms worsen or if the condition fails to improve as anticipated.  I provided 11 minutes of non face-to-face telephone visit time during this encounter, and > 50% was spent counseling as documented under my assessment & plan.  Ashlay Altieri L Enes Rokosz, PA-C 07/23/2018 11:27 AM  No orders of the defined types were placed in this encounter.    Kaidance Pantoja L Kahlan Engebretson, PA-C 07/23/18  ADDENDUM: Hematology/Oncology Attending: I contributed to the telephone visit with the patient and his wife today.  This is a very pleasant 32 years old white male with metastatic high-grade neuroendocrine carcinoma who was recently started on treatment with reduced dose cisplatin and irinotecan and has been tolerating the treatment well except for dehydration and fatigue.  His last scan showed significant improvement of his disease.  Unfortunately the patient left the chemo room last week without treatment because he did not want to stay.  He is scheduled to resume his treatment tomorrow and I had a lengthy discussion with the patient and his wife about his condition and the importance of continuing the treatment unless he decided on palliative care and hospice.  The patient is a still interested in treatment and he is  expected to show up tomorrow as planned. He also complained of low back pain with radiation to the leg. We will evaluate the patient physically tomorrow when he comes for his treatment. He was advised to keep his appointment as planned and we will continue to monitor him closely. The patient was also advised to call immediately if he has any other concerning symptoms in the interval.  Disclaimer: This note was dictated with voice recognition software. Similar sounding words can inadvertently be transcribed and may be missed upon review. Eilleen Kempf, MD 07/23/18

## 2018-07-24 ENCOUNTER — Inpatient Hospital Stay: Payer: No Typology Code available for payment source

## 2018-07-24 ENCOUNTER — Other Ambulatory Visit: Payer: No Typology Code available for payment source

## 2018-07-24 ENCOUNTER — Other Ambulatory Visit: Payer: Self-pay

## 2018-07-24 ENCOUNTER — Ambulatory Visit: Payer: No Typology Code available for payment source

## 2018-07-24 VITALS — BP 111/85 | HR 89 | Temp 98.2°F | Resp 18

## 2018-07-24 DIAGNOSIS — Z5111 Encounter for antineoplastic chemotherapy: Secondary | ICD-10-CM | POA: Diagnosis not present

## 2018-07-24 DIAGNOSIS — C7A8 Other malignant neuroendocrine tumors: Secondary | ICD-10-CM

## 2018-07-24 LAB — CMP (CANCER CENTER ONLY)
ALT: 19 U/L (ref 0–44)
AST: 18 U/L (ref 15–41)
Albumin: 3.1 g/dL — ABNORMAL LOW (ref 3.5–5.0)
Alkaline Phosphatase: 84 U/L (ref 38–126)
Anion gap: 11 (ref 5–15)
BUN: 19 mg/dL (ref 8–23)
CO2: 22 mmol/L (ref 22–32)
Calcium: 8.5 mg/dL — ABNORMAL LOW (ref 8.9–10.3)
Chloride: 100 mmol/L (ref 98–111)
Creatinine: 0.83 mg/dL (ref 0.61–1.24)
GFR, Est AFR Am: >60 mL/min (ref >60)
GFR, Estimated: >60 mL/min (ref >60)
Glucose, Bld: 140 mg/dL — ABNORMAL HIGH (ref 70–99)
Potassium: 3.8 mmol/L (ref 3.5–5.1)
Sodium: 133 mmol/L — ABNORMAL LOW (ref 135–145)
Total Bilirubin: 0.7 mg/dL (ref 0.3–1.2)
Total Protein: 6.1 g/dL — ABNORMAL LOW (ref 6.5–8.1)

## 2018-07-24 LAB — CBC WITH DIFFERENTIAL (CANCER CENTER ONLY)
Abs Immature Granulocytes: 0.06 10*3/uL (ref 0.00–0.07)
Basophils Absolute: 0 10*3/uL (ref 0.0–0.1)
Basophils Relative: 0 %
Eosinophils Absolute: 0 10*3/uL (ref 0.0–0.5)
Eosinophils Relative: 0 %
HCT: 43.3 % (ref 39.0–52.0)
Hemoglobin: 14.8 g/dL (ref 13.0–17.0)
Immature Granulocytes: 1 %
Lymphocytes Relative: 2 %
Lymphs Abs: 0.2 10*3/uL — ABNORMAL LOW (ref 0.7–4.0)
MCH: 31 pg (ref 26.0–34.0)
MCHC: 34.2 g/dL (ref 30.0–36.0)
MCV: 90.6 fL (ref 80.0–100.0)
Monocytes Absolute: 0.3 10*3/uL (ref 0.1–1.0)
Monocytes Relative: 4 %
Neutro Abs: 8.6 10*3/uL — ABNORMAL HIGH (ref 1.7–7.7)
Neutrophils Relative %: 93 %
Platelet Count: 213 10*3/uL (ref 150–400)
RBC: 4.78 MIL/uL (ref 4.22–5.81)
RDW: 19.8 % — ABNORMAL HIGH (ref 11.5–15.5)
WBC Count: 9.2 10*3/uL (ref 4.0–10.5)
nRBC: 0 % (ref 0.0–0.2)

## 2018-07-24 LAB — MAGNESIUM: Magnesium: 1.6 mg/dL — ABNORMAL LOW (ref 1.7–2.4)

## 2018-07-24 MED ORDER — SODIUM CHLORIDE 0.9 % IV SOLN
25.0000 mg/m2 | Freq: Once | INTRAVENOUS | Status: AC
  Administered 2018-07-24: 15:00:00 62 mg via INTRAVENOUS
  Filled 2018-07-24: qty 62

## 2018-07-24 MED ORDER — PALONOSETRON HCL INJECTION 0.25 MG/5ML
INTRAVENOUS | Status: AC
  Filled 2018-07-24: qty 5

## 2018-07-24 MED ORDER — PALONOSETRON HCL INJECTION 0.25 MG/5ML
0.2500 mg | Freq: Once | INTRAVENOUS | Status: AC
  Administered 2018-07-24: 12:00:00 0.25 mg via INTRAVENOUS

## 2018-07-24 MED ORDER — ATROPINE SULFATE 1 MG/ML IJ SOLN
0.5000 mg | Freq: Once | INTRAMUSCULAR | Status: AC | PRN
  Administered 2018-07-24: 0.5 mg via INTRAVENOUS

## 2018-07-24 MED ORDER — SODIUM CHLORIDE 0.9 % IV SOLN
Freq: Once | INTRAVENOUS | Status: AC
  Administered 2018-07-24: 09:00:00 via INTRAVENOUS
  Filled 2018-07-24: qty 250

## 2018-07-24 MED ORDER — HEPARIN SOD (PORK) LOCK FLUSH 100 UNIT/ML IV SOLN
500.0000 [IU] | Freq: Once | INTRAVENOUS | Status: AC | PRN
  Filled 2018-07-24: qty 5

## 2018-07-24 MED ORDER — SODIUM CHLORIDE 0.9 % IV SOLN
Freq: Once | INTRAVENOUS | Status: AC
  Administered 2018-07-24: 12:00:00 via INTRAVENOUS
  Filled 2018-07-24: qty 5

## 2018-07-24 MED ORDER — SODIUM CHLORIDE 0.9% FLUSH
10.0000 mL | INTRAVENOUS | Status: AC | PRN
  Filled 2018-07-24: qty 10

## 2018-07-24 MED ORDER — ATROPINE SULFATE 1 MG/ML IJ SOLN
INTRAMUSCULAR | Status: AC
  Filled 2018-07-24: qty 1

## 2018-07-24 MED ORDER — IRINOTECAN HCL CHEMO INJECTION 100 MG/5ML
50.0000 mg/m2 | Freq: Once | INTRAVENOUS | Status: AC
  Administered 2018-07-24: 120 mg via INTRAVENOUS
  Filled 2018-07-24: qty 6

## 2018-07-24 MED ORDER — POTASSIUM CHLORIDE 2 MEQ/ML IV SOLN
Freq: Once | INTRAVENOUS | Status: AC
  Administered 2018-07-24: 10:00:00 via INTRAVENOUS
  Filled 2018-07-24: qty 10

## 2018-07-24 NOTE — Progress Notes (Signed)
Ok to tx per Dr. Julien Nordmann w/ 150 cc urine out  Ok per Dr. Earlie Server to run post cisplatin fluids along with cisplatin

## 2018-07-24 NOTE — Patient Instructions (Signed)
[facility] Discharge Instructions for Patients Receiving Chemotherapy  Today you received the following chemotherapy agents Irinotecan and Cisplatin  To help prevent nausea and vomiting after your treatment, we encourage you to take your nausea medication as prescribed.   If you develop nausea and vomiting that is not controlled by your nausea medication, call the clinic.   BELOW ARE SYMPTOMS THAT SHOULD BE REPORTED IMMEDIATELY:  *FEVER GREATER THAN 100.5 F  *CHILLS WITH OR WITHOUT FEVER  NAUSEA AND VOMITING THAT IS NOT CONTROLLED WITH YOUR NAUSEA MEDICATION  *UNUSUAL SHORTNESS OF BREATH  *UNUSUAL BRUISING OR BLEEDING  TENDERNESS IN MOUTH AND THROAT WITH OR WITHOUT PRESENCE OF ULCERS  *URINARY PROBLEMS  *BOWEL PROBLEMS  UNUSUAL RASH Items with * indicate a potential emergency and should be followed up as soon as possible.  Feel free to call the clinic should you have any questions or concerns. The clinic phone number is [phone].  Please show the [facility] at check-in to the Emergency Department and triage nurse.    Coronavirus (COVID-19) Are you at risk?  Are you at risk for the Coronavirus (COVID-19)?  To be considered HIGH RISK for Coronavirus (COVID-19), you have to meet the following criteria:  . Traveled to [place], [place], [place], [place] or [place]; or in the [place] to [place], [place], [place], or [place]; and have fever, cough, and shortness of breath within the last 2 weeks of travel OR . Been in close contact with a person diagnosed with COVID-19 within the last 2 weeks and have fever, cough, and shortness of breath . IF YOU DO NOT MEET THESE CRITERIA, YOU ARE CONSIDERED LOW RISK FOR COVID-19.  What to do if you are HIGH RISK for COVID-19?  Marland Kitchen If you are having a medical emergency, call 911. . Seek medical care right away. Before you go to a doctor's office, urgent care or emergency department, call ahead and tell  them about your recent travel, contact with someone diagnosed with COVID-19, and your symptoms. You should receive instructions from your physician's office regarding next steps of care.  . When you arrive at healthcare provider, tell the healthcare staff immediately you have returned from visiting [place], [place], [place], [place] or [place]; or traveled in the [place] to [place], [place], [place], or [place]; in the last two weeks or you have been in close contact with a person diagnosed with COVID-19 in the last 2 weeks.   . Tell the health care staff about your symptoms: fever, cough and shortness of breath. . After you have been seen by a medical provider, you will be either: o Tested for (COVID-19) and discharged home on quarantine except to seek medical care if symptoms worsen, and asked to  - Stay home and avoid contact with others until you get your results (4-5 days)  - Avoid travel on public transportation if possible (such as bus, train, or airplane) or o Sent to the Emergency Department by EMS for evaluation, COVID-19 testing, and possible admission depending on your condition and test results.  What to do if you are LOW RISK for COVID-19?  Reduce your risk of any infection by using the same precautions used for avoiding the common cold or flu:  Marland Kitchen Wash your hands often with soap and warm water for at least 20 seconds.  If soap and water are not readily available, use an alcohol-based hand sanitizer with at least 60% alcohol.  Marland Kitchen  If coughing or sneezing, cover your mouth and nose by coughing or sneezing into the elbow areas of your shirt or coat, into a tissue or into your sleeve (not your hands). . Avoid shaking hands with others and consider head nods or verbal greetings only. . Avoid touching your eyes, nose, or mouth with unwashed hands.  . Avoid close contact with people who are sick. . Avoid places or events with large numbers of people in one location, like concerts or  sporting events. . Carefully consider travel plans you have or are making. . If you are planning any travel outside or inside the [place], visit the CDC's Travelers' Health webpage for the latest health notices. . If you have some symptoms but not all symptoms, continue to monitor at home and seek medical attention if your symptoms worsen. . If you are having a medical emergency, call 911.   [facility] / e-Visit: [email or web address]         [facility]: [facility]: [phone]                   [facility]: [phone]

## 2018-07-25 ENCOUNTER — Inpatient Hospital Stay: Payer: No Typology Code available for payment source | Admitting: Nutrition

## 2018-07-25 NOTE — Progress Notes (Signed)
Nutrition follow-up completed with patient's wife.  Patient has metastatic brain cancer. Wife reports his confusion is better.  He now seems to have a better appetite since starting steroids. She has picked up samples of Ensure Enlive but has not been able to test these with the patient yet. There is no new weight documented. Wife reports she and patient are going out of town and will be gone briefly.  Nutrition diagnosis: Unintended weight loss cannot be evaluated.  Intervention: Recommended patient continue strategies for increased calorie and protein intake. Encouraged patient's wife to offer patient oral nutrition supplements/milkshakes. Questions were answered.  Teach back method used. Patient's wife prefers to follow-up with me as needed.  Monitoring, evaluation, goals: Patient will work to increase calories and protein to minimize weight loss.  Next visit: Patient's wife will contact me for questions or concerns.  **Disclaimer: This note was dictated with voice recognition software. Similar sounding words can inadvertently be transcribed and this note may contain transcription errors which may not have been corrected upon publication of note.**

## 2018-07-31 ENCOUNTER — Other Ambulatory Visit: Payer: PRIVATE HEALTH INSURANCE

## 2018-07-31 ENCOUNTER — Telehealth: Payer: Self-pay | Admitting: *Deleted

## 2018-07-31 ENCOUNTER — Inpatient Hospital Stay: Payer: No Typology Code available for payment source

## 2018-07-31 ENCOUNTER — Other Ambulatory Visit: Payer: Self-pay | Admitting: Internal Medicine

## 2018-07-31 ENCOUNTER — Ambulatory Visit: Payer: PRIVATE HEALTH INSURANCE

## 2018-07-31 ENCOUNTER — Ambulatory Visit: Payer: PRIVATE HEALTH INSURANCE | Admitting: Physician Assistant

## 2018-07-31 NOTE — Telephone Encounter (Signed)
TCT patient/pt's wife as patient had not shown up for his labs and treatment today. Spoke with pt's wife.  She states they are in [place], but that pt was admitted to a hospital in [place] for sudden severe abdominal pain. Pt had emergency surgery for 'a hole in his descending colon' yesterday. He is currently in the SICU but wife expects that he will be moved out to a regular room today.  She states he is doing ok though has some confusion.  Asked wife to keep [place] up to date on his status.  Wife stated she would. Let wife know that I would inform Dr. Julien Nordmann.

## 2018-08-08 ENCOUNTER — Ambulatory Visit: Payer: PRIVATE HEALTH INSURANCE

## 2018-08-08 ENCOUNTER — Ambulatory Visit: Payer: PRIVATE HEALTH INSURANCE | Admitting: Internal Medicine

## 2018-08-08 ENCOUNTER — Other Ambulatory Visit: Payer: PRIVATE HEALTH INSURANCE

## 2018-08-10 ENCOUNTER — Telehealth: Payer: Self-pay | Admitting: *Deleted

## 2018-08-10 NOTE — Telephone Encounter (Signed)
Records faxed to [facility] - Release [phone]

## 2018-08-13 ENCOUNTER — Other Ambulatory Visit: Payer: Self-pay | Admitting: Radiation Therapy

## 2018-08-13 ENCOUNTER — Telehealth: Payer: Self-pay | Admitting: Medical Oncology

## 2018-08-13 ENCOUNTER — Inpatient Hospital Stay: Payer: BC Managed Care – PPO | Admitting: Internal Medicine

## 2018-08-13 ENCOUNTER — Inpatient Hospital Stay: Payer: BC Managed Care – PPO

## 2018-08-13 ENCOUNTER — Telehealth: Payer: Self-pay | Admitting: *Deleted

## 2018-08-13 NOTE — Telephone Encounter (Signed)
XRT records request . Pt still in [place] hospital.  Faxed xrt note from [facility] and other records. Dr Julien Nordmann talked to Dr Maudie Mercury this am and gave him Dr Lanell Persons number.

## 2018-08-13 NOTE — Telephone Encounter (Signed)
On 08-13-18 faxed medical records to [facility]  Id [phone]

## 2018-08-14 ENCOUNTER — Inpatient Hospital Stay: Payer: BC Managed Care – PPO

## 2018-08-14 ENCOUNTER — Inpatient Hospital Stay: Payer: BC Managed Care – PPO | Admitting: Nutrition

## 2018-08-15 ENCOUNTER — Ambulatory Visit: Payer: PRIVATE HEALTH INSURANCE

## 2018-08-15 ENCOUNTER — Other Ambulatory Visit: Payer: PRIVATE HEALTH INSURANCE

## 2018-08-16 ENCOUNTER — Inpatient Hospital Stay: Admission: RE | Admit: 2018-08-16 | Payer: PRIVATE HEALTH INSURANCE | Source: Ambulatory Visit

## 2018-08-21 ENCOUNTER — Inpatient Hospital Stay: Payer: BC Managed Care – PPO

## 2018-08-21 ENCOUNTER — Telehealth: Payer: Self-pay | Admitting: *Deleted

## 2018-08-21 NOTE — Telephone Encounter (Signed)
Received vm message from pt's wife, Dwayne Massey.  She states that she and her husband remain in [place], pt has been hospitalized there, s/p emergency surgery and now has a colostomy. She also states they will be starting with Hospice services and staying in [place] for the forseeable future. She expresses her gratitude for all Dr. Julien Nordmann and his team have done for her husband.  Future appts have been cancelled

## 2018-10-11 DEATH — deceased

## 2018-12-13 ENCOUNTER — Encounter: Payer: Self-pay | Admitting: Radiation Therapy

## 2019-08-28 IMAGING — CT CT HEAD WITHOUT CONTRAST
2 of 3 series · 14 of 47 positions shown, 17 images · non-contrast
Comparison: Brain MRI April 27, 2018

CLINICAL DATA: Patient with known metastatic lung carcinoma with
increased confusion and altered mental status

EXAM:
CT HEAD WITHOUT CONTRAST
TECHNIQUE: Contiguous axial images were obtained from the base of the skull
through the vertex without intravenous contrast.

[Series 2: head wo · axial · 0.54mm/px · z∈[-62,+73]mm · 11 of 33 slices shown, 14 images]
[im 3/33  brain]
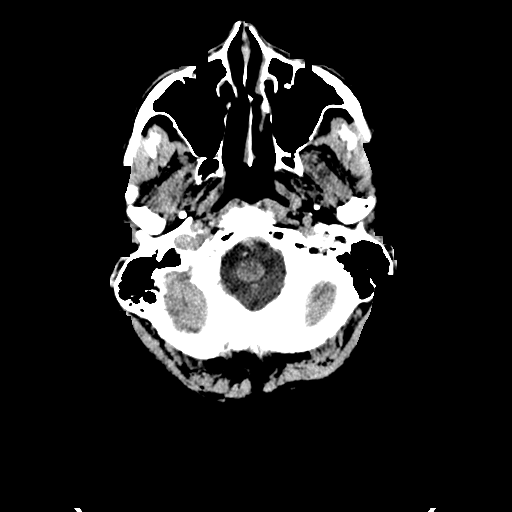
[im 3/33  bone]
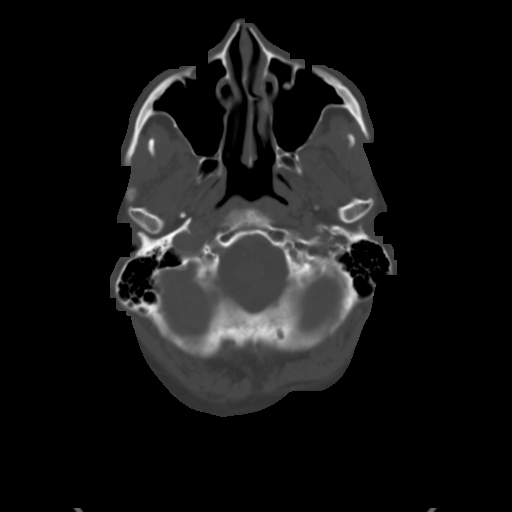
[im 5/33  brain]
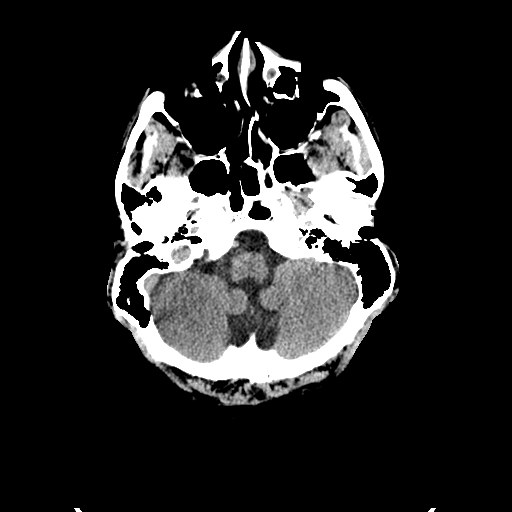
[im 8/33  brain]
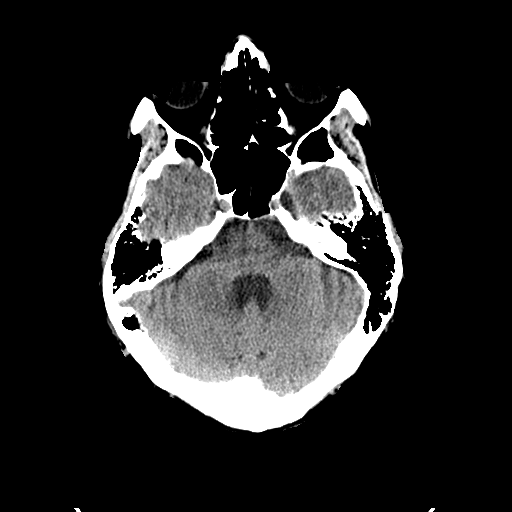
[im 10/33  brain]
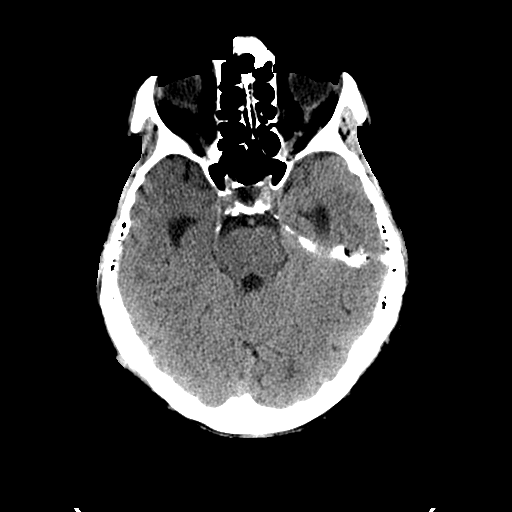
[im 14/33  brain]
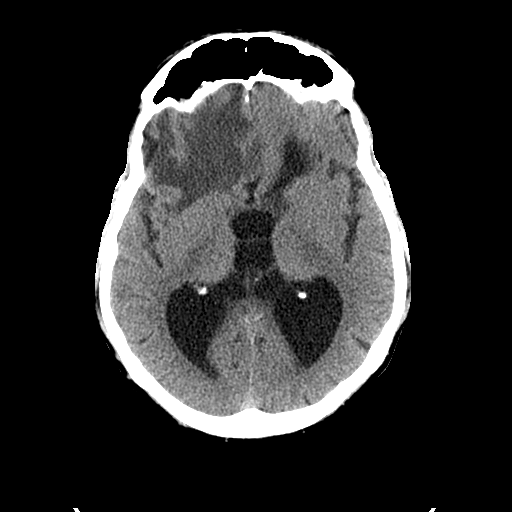
[im 14/33  bone]
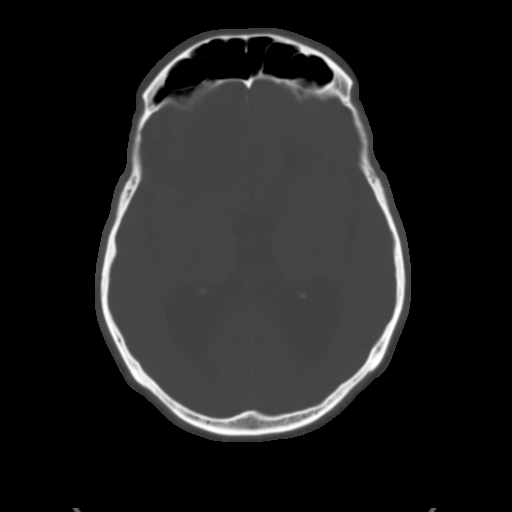
[im 17/33  brain]
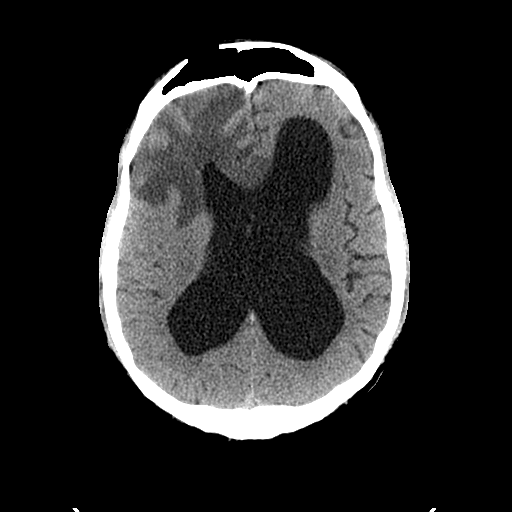
[im 19/33  brain]
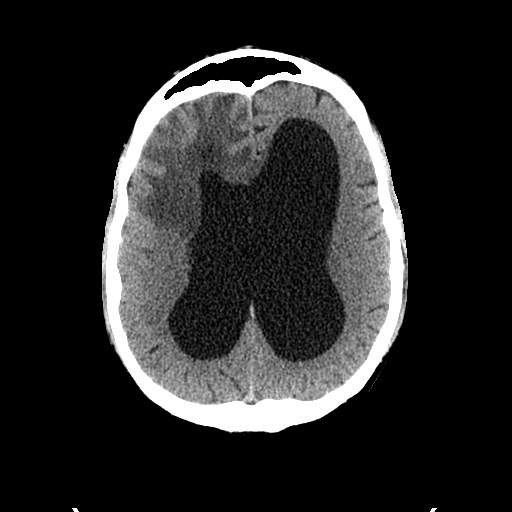
[im 23/33  brain]
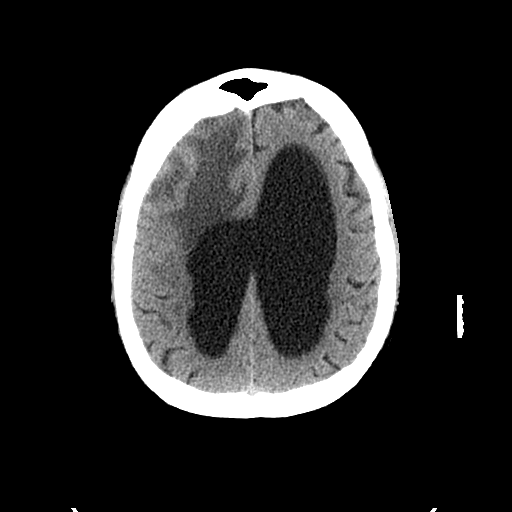
[im 25/33  brain]
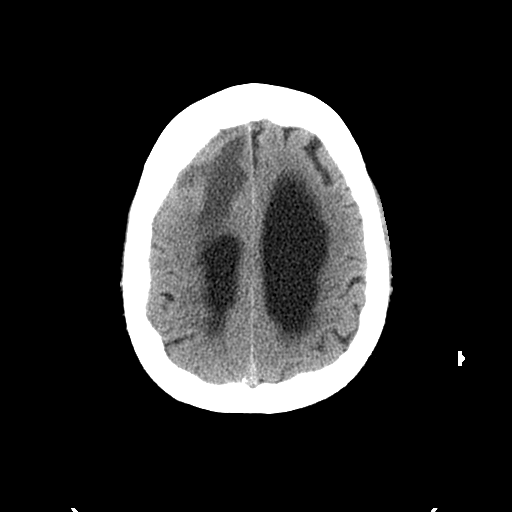
[im 25/33  bone]
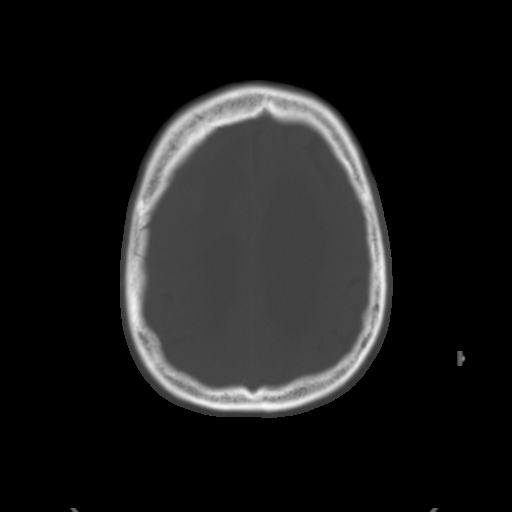
[im 28/33  brain]
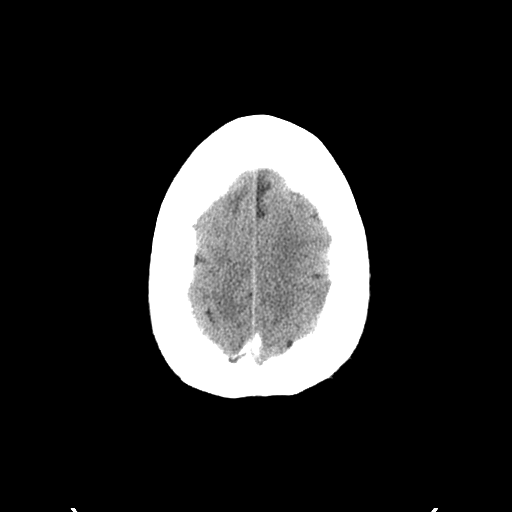
[im 30/33  brain]
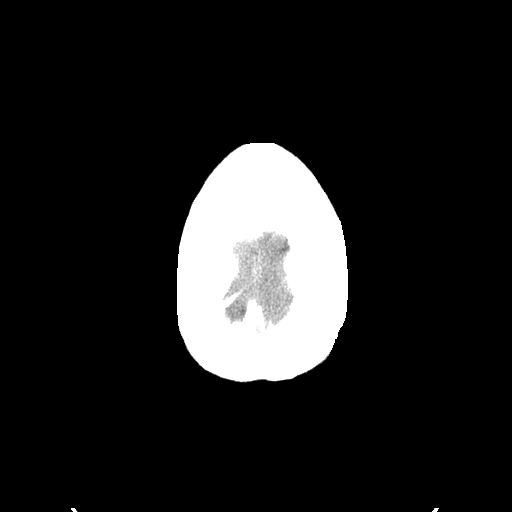

[Series 4: coronal soft tissue · coronal · 0.37mm/px · 3 of 82 slices shown]
[im 28/82  brain]
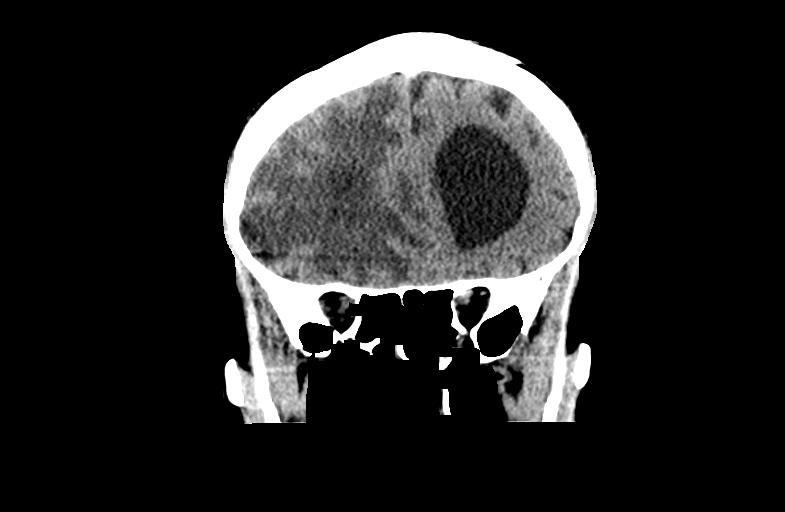
[im 37/82  brain]
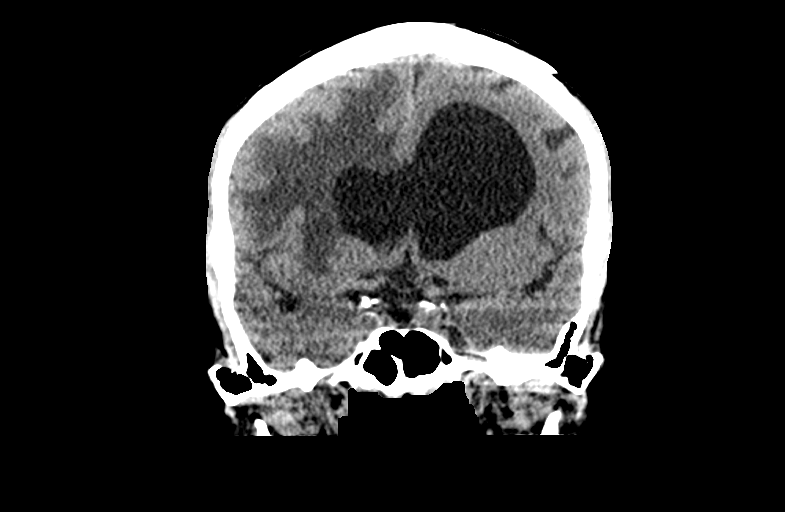
[im 46/82  brain]
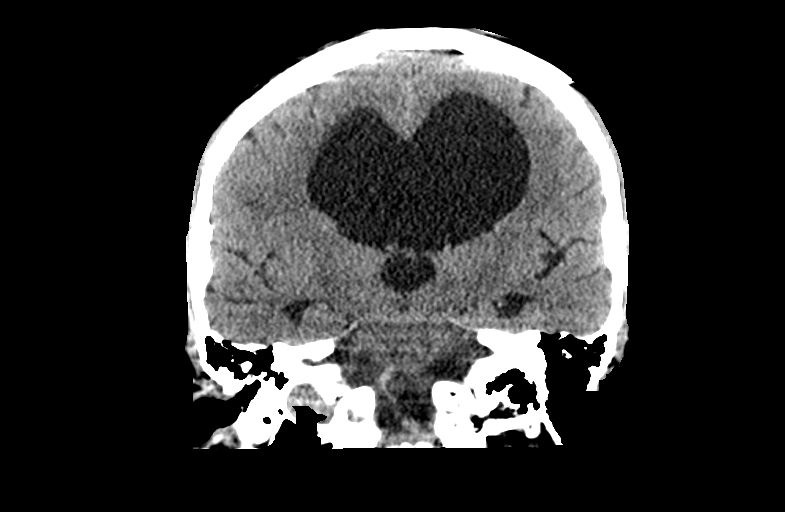

[14 of 47 positions shown; findings below may reference images not displayed]

FINDINGS: Brain: Diffuse enlargement of the ventricles is a stable finding.
The sulci appear unremarkable.

There is extensive vasogenic edema throughout much of the right
frontal lobe which was present previously but appears slightly more
extensive. No other vasogenic edema is evident. The known multiple
masses throughout the brain documented on prior MR are not
delineated focally on this noncontrast enhanced study. There is no
hemorrhage. There Is impression on the frontal horn the right
lateral ventricle due to the edema, more extensive than on the
previous study. No midline shift evident, however. No extra-axial
fluid collections are appreciable.

Vascular: No appreciable hyperdense vessel. There is mild
calcification in each carotid siphon region.

Skull: The bony calvarium appears intact.

Sinuses/Orbits: There is mucosal thickening in several ethmoid air
cells. Other visualized paranasal sinuses are clear. Orbits appear
symmetric bilaterally.

Other: Visualized mastoid air cells are clear.
IMPRESSION: 1. There is extensive vasogenic edema throughout the right frontal
lobe which shows a slight increase compared to prior MR from April 2018. There is more mass effect on the frontal horn the right
lateral ventricle compared to the previous study. The known mass in
this area is not well delineated on this noncontrast enhanced study.

2. Diffuse ventricular enlargement remains. Suspect a degree of
underlying normal pressure hydrocephalus.

3. Recent MR showed multiple masses throughout the brain which are
not delineated on this noncontrast enhanced study. If further
evaluation of these masses and direct comparison of these masses
compared to the previous study is felt to be warranted, would advise
MR pre and post-contrast to further evaluate.

4. No acute infarct is demonstrable. No hemorrhage. No midline
shift.

5.  Mild mucosal thickening noted in several ethmoid air cells.

## 2019-08-28 IMAGING — US ULTRASOUND OF SCROTUM
1 series · 14 of 25 positions shown · non-contrast
Comparison: No priors.

CLINICAL DATA: 63-year-old male with history of left-sided
testicular swelling.

EXAM:
SCROTAL ULTRASOUND
DOPPLER ULTRASOUND OF THE TESTICLES
TECHNIQUE: Complete ultrasound examination of the testicles, epididymis, and
other scrotal structures was performed. Color and spectral Doppler
ultrasound were also utilized to evaluate blood flow to the
testicles.

[Series 1: ultrasound of scrotum · 14 of 36 slices shown]
[im 1/36]
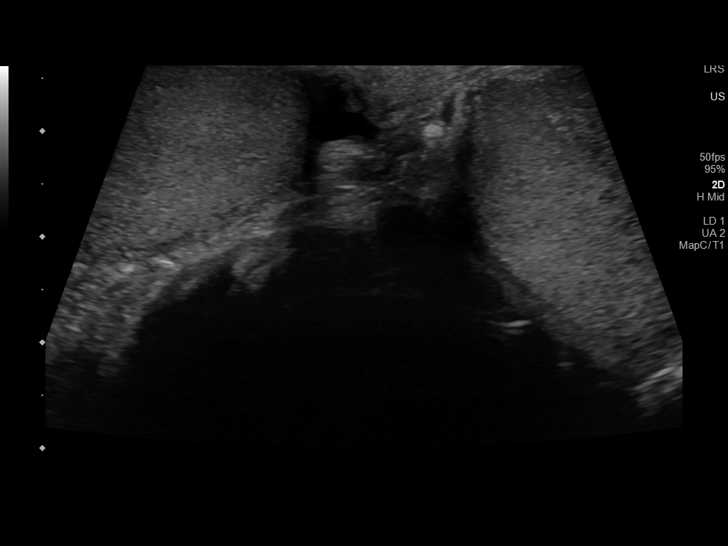
[im 3/36]
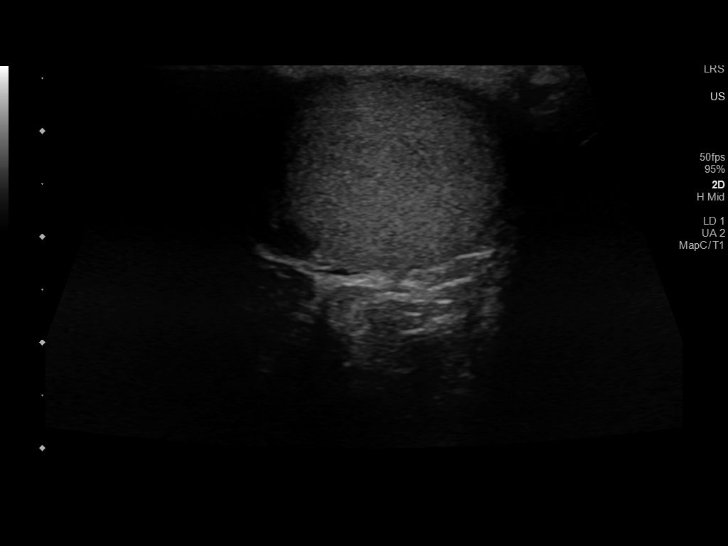
[im 6/36]
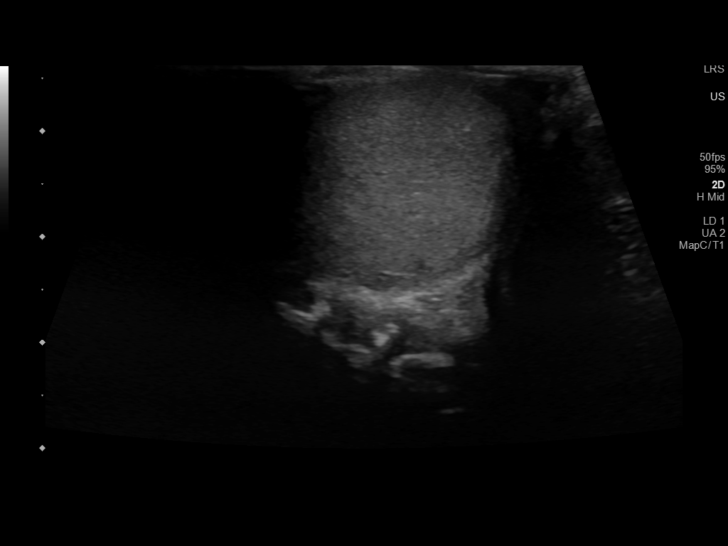
[im 9/36]
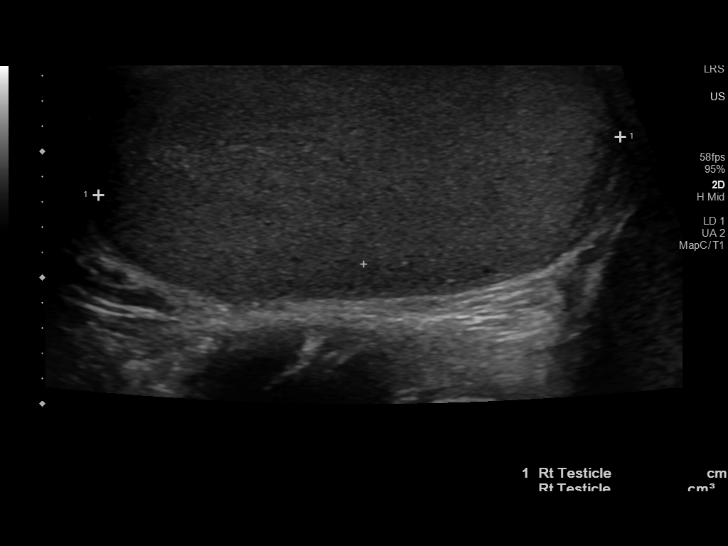
[im 12/36]
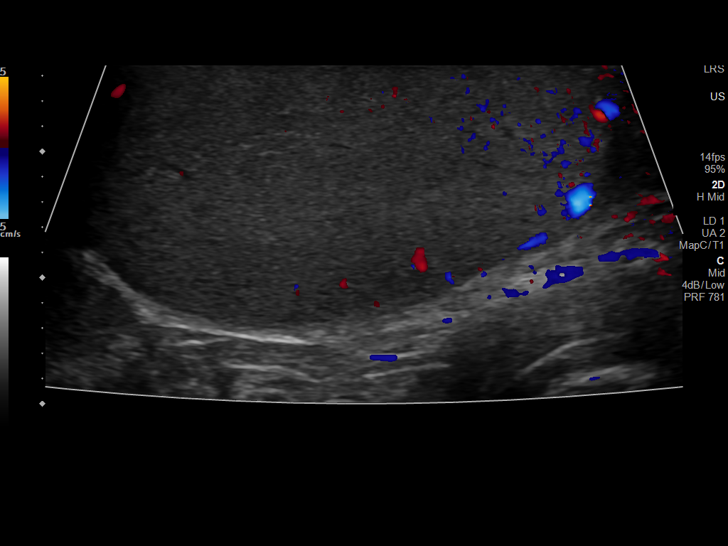
[im 14/36]
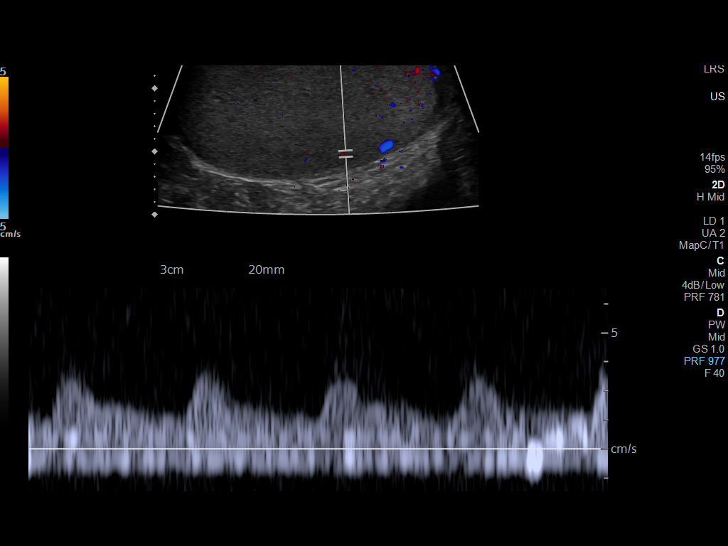
[im 17/36]
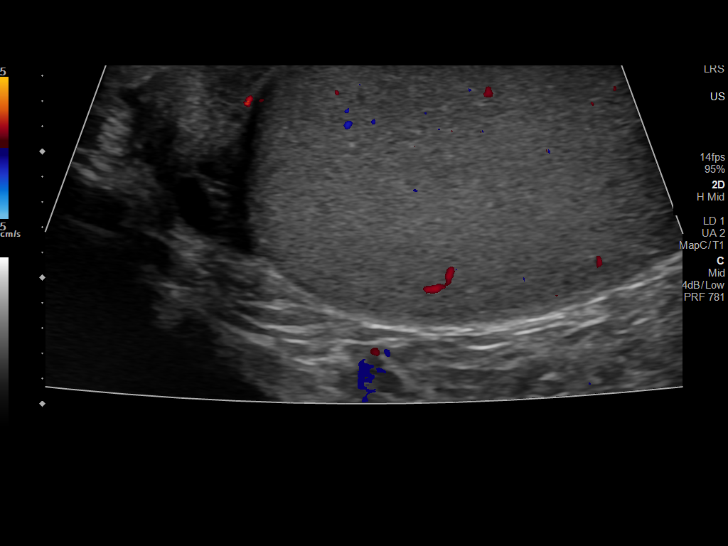
[im 19/36]
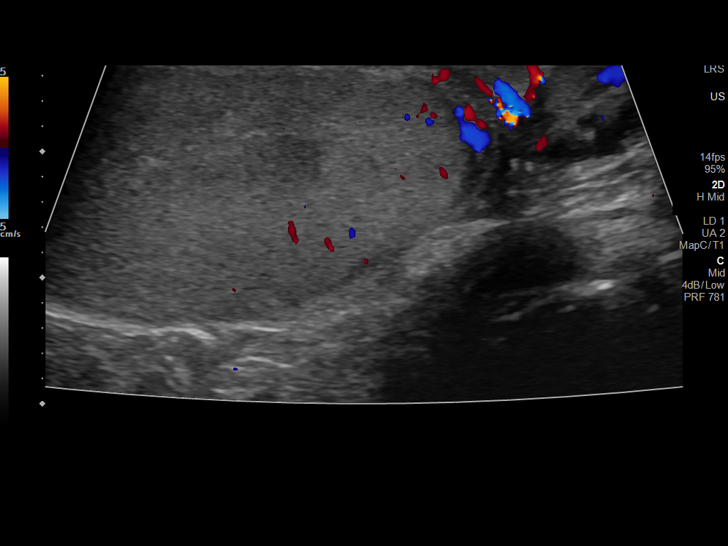
[im 22/36]
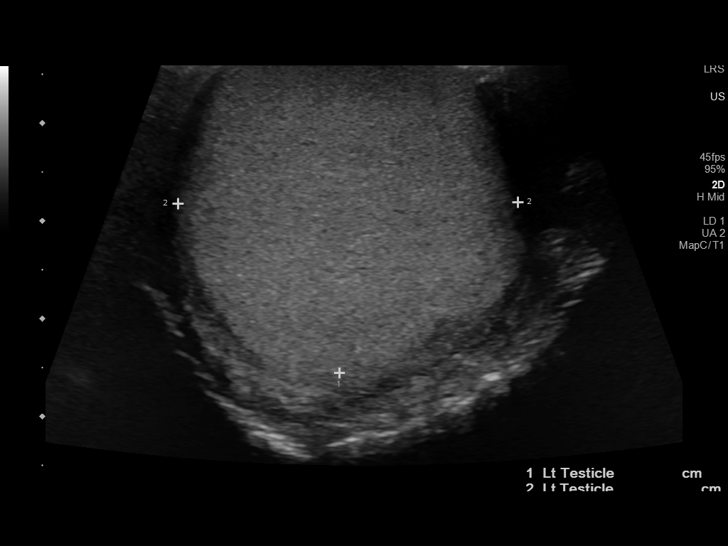
[im 24/36]
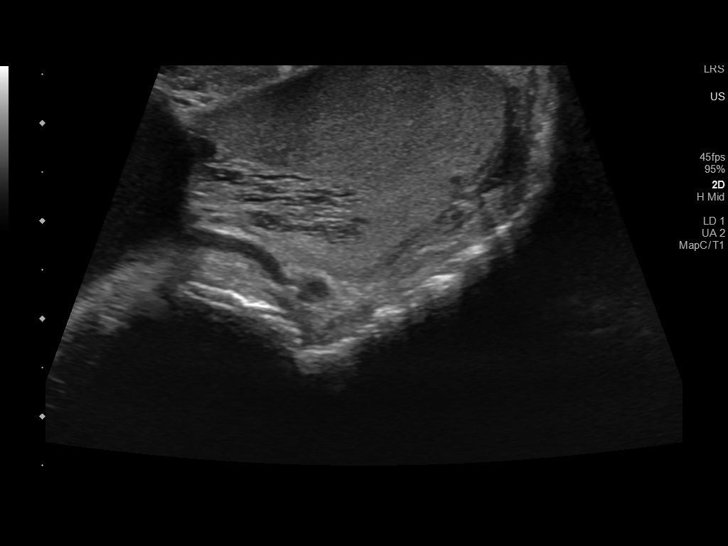
[im 27/36]
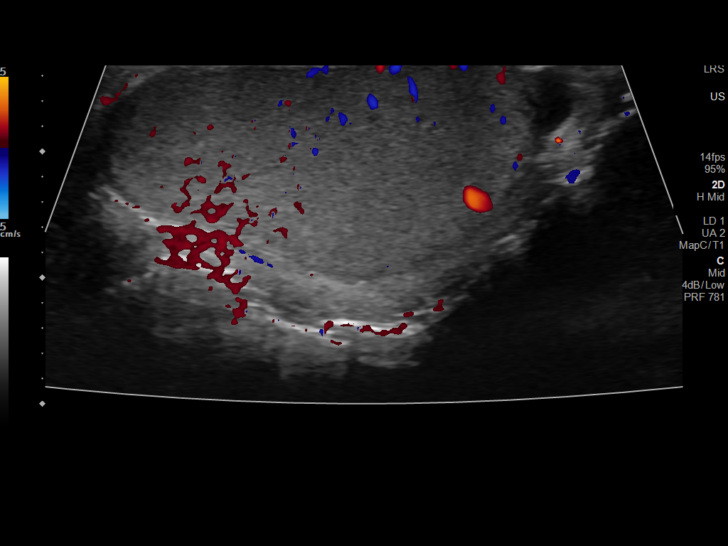
[im 30/36]
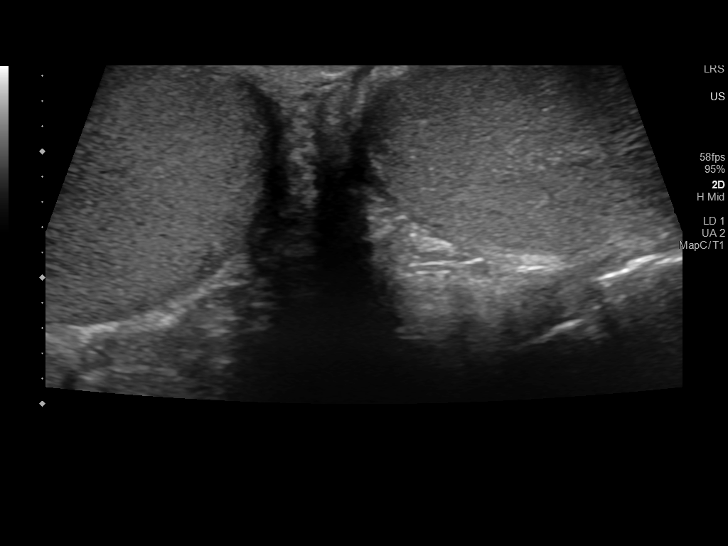
[im 33/36]
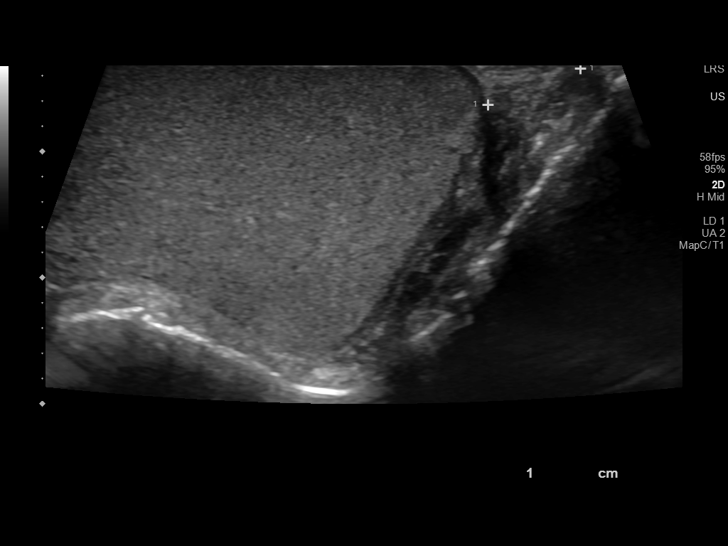
[im 36/36]
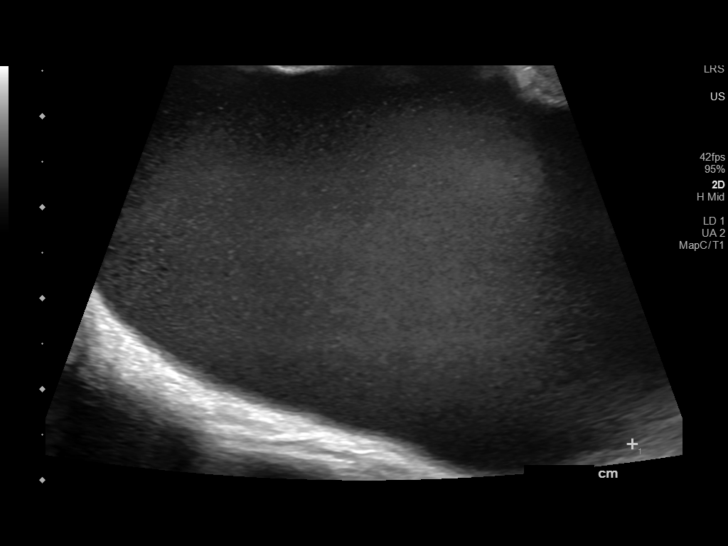

[14 of 25 positions shown; findings below may reference images not displayed]

FINDINGS: Right testicle

Measurements: 4.2 x 2.5 x 3.1 cm. No mass or microlithiasis
visualized.

Left testicle

Measurements: 4.2 x 3.3 x 3.5 cm. No mass or microlithiasis
visualized.

Right epididymis:  Normal in size and appearance.

Left epididymis:  Normal in size and appearance.

Hydrocele: Left-sided hydrocele with low-level internal
echogenicity.

Varicocele:  None visualized.

Pulsed Doppler interrogation of both testes demonstrates normal low
resistance arterial and venous waveforms bilaterally.
IMPRESSION: 1. Moderate left-sided hydrocele.
2. Normal sonographic appearance of the testicles and epididymides
bilaterally.

## 2019-08-28 IMAGING — CR CHEST - 2 VIEW
2 series · 2 of 2 positions shown · non-contrast
Comparison: Portable exam 5555 hours compared to CT chest
05/11/2018

CLINICAL DATA: Weakness, history stage IV lung cancer

EXAM:
CHEST - 2 VIEW

[w chest lat]
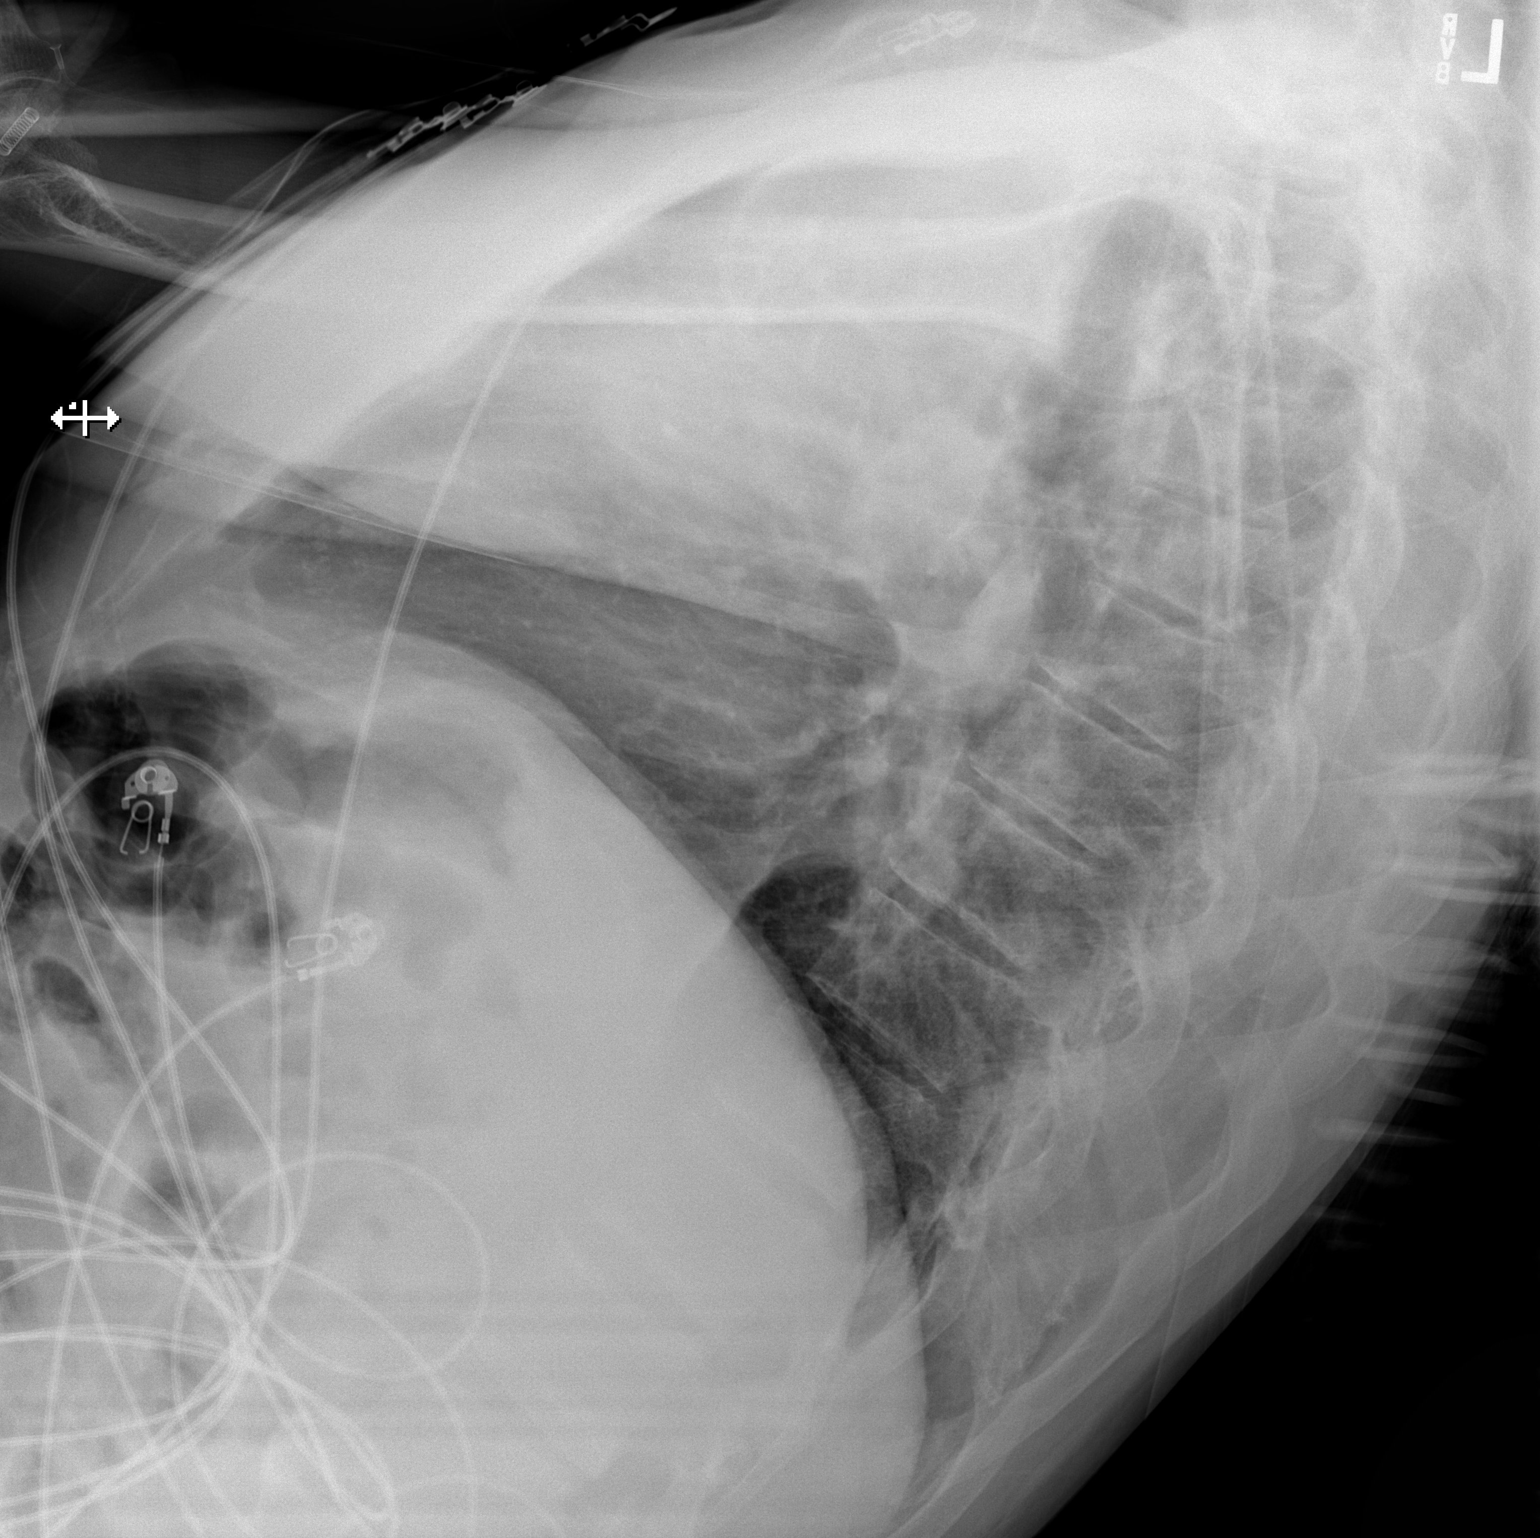

[x chest ap]
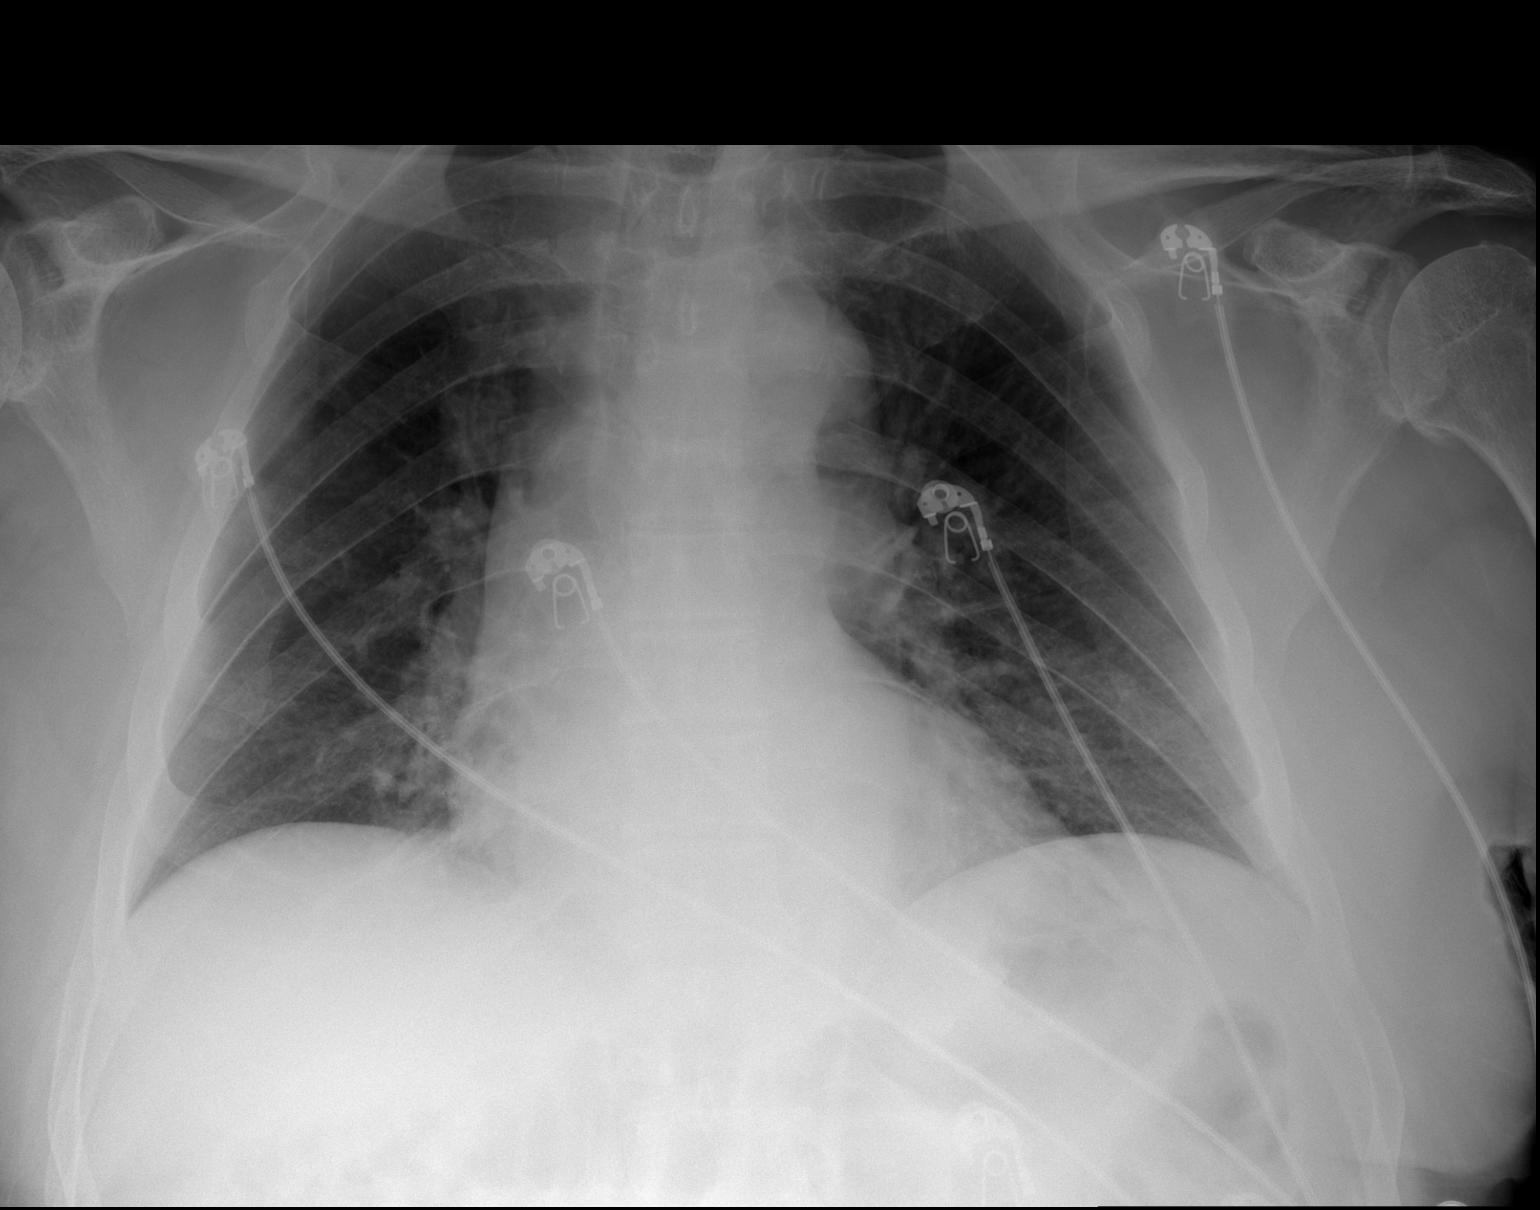

[2 of 2 positions shown; findings below may reference images not displayed]

FINDINGS: Normal heart size, mediastinal contours, and pulmonary vascularity.

Mild chronic RIGHT basilar atelectasis.

RIGHT suprahilar nodular density unchanged corresponding to known
tumor.

Lungs otherwise clear.

No infiltrate, pleural effusion or pneumothorax.
IMPRESSION: Known RIGHT upper lobe tumor and chronic RIGHT basilar atelectasis.

No acute abnormalities.
